# Patient Record
Sex: Male | Born: 1940 | Race: White | Hispanic: No | Marital: Married | Smoking: Never smoker
Health system: Southern US, Community
[De-identification: ages and names within clinical notes are randomized; demographics above are authoritative.]

## PROBLEM LIST (undated history)

## (undated) DIAGNOSIS — C679 Malignant neoplasm of bladder, unspecified: Secondary | ICD-10-CM

## (undated) DIAGNOSIS — E119 Type 2 diabetes mellitus without complications: Secondary | ICD-10-CM

## (undated) DIAGNOSIS — E785 Hyperlipidemia, unspecified: Secondary | ICD-10-CM

## (undated) DIAGNOSIS — F0390 Unspecified dementia without behavioral disturbance: Secondary | ICD-10-CM

## (undated) DIAGNOSIS — G309 Alzheimer's disease, unspecified: Secondary | ICD-10-CM

## (undated) DIAGNOSIS — F0280 Dementia in other diseases classified elsewhere without behavioral disturbance: Secondary | ICD-10-CM

## (undated) DIAGNOSIS — R569 Unspecified convulsions: Secondary | ICD-10-CM

## (undated) DIAGNOSIS — C669 Malignant neoplasm of unspecified ureter: Secondary | ICD-10-CM

## (undated) HISTORY — PX: OTHER SURGICAL HISTORY: SHX169

---

## 2006-10-16 ENCOUNTER — Emergency Department: Payer: Self-pay | Admitting: Emergency Medicine

## 2007-11-03 ENCOUNTER — Ambulatory Visit: Payer: Self-pay | Admitting: Surgery

## 2007-11-03 ENCOUNTER — Other Ambulatory Visit: Payer: Self-pay

## 2007-11-10 ENCOUNTER — Ambulatory Visit: Payer: Self-pay | Admitting: Surgery

## 2008-02-08 ENCOUNTER — Ambulatory Visit: Payer: Self-pay

## 2008-03-12 ENCOUNTER — Ambulatory Visit: Payer: Self-pay | Admitting: Urology

## 2008-04-09 ENCOUNTER — Ambulatory Visit: Payer: Self-pay | Admitting: Urology

## 2008-04-17 ENCOUNTER — Ambulatory Visit: Payer: Self-pay | Admitting: Urology

## 2008-08-08 ENCOUNTER — Ambulatory Visit: Payer: Self-pay | Admitting: Urology

## 2008-08-14 ENCOUNTER — Ambulatory Visit: Payer: Self-pay | Admitting: Urology

## 2009-10-13 ENCOUNTER — Ambulatory Visit: Payer: Self-pay | Admitting: Specialist

## 2010-07-19 ENCOUNTER — Emergency Department: Payer: Self-pay | Admitting: Unknown Physician Specialty

## 2010-11-05 ENCOUNTER — Emergency Department: Payer: Self-pay | Admitting: *Deleted

## 2012-05-08 ENCOUNTER — Emergency Department: Payer: Self-pay | Admitting: Emergency Medicine

## 2012-08-24 ENCOUNTER — Emergency Department: Payer: Self-pay | Admitting: Emergency Medicine

## 2012-08-24 LAB — CBC
HCT: 40.3 % (ref 40.0–52.0)
MCH: 30.8 pg (ref 26.0–34.0)
MCHC: 34 g/dL (ref 32.0–36.0)
Platelet: 204 10*3/uL (ref 150–440)
RBC: 4.45 10*6/uL (ref 4.40–5.90)

## 2012-08-24 LAB — COMPREHENSIVE METABOLIC PANEL
Albumin: 3.6 g/dL (ref 3.4–5.0)
Alkaline Phosphatase: 75 U/L (ref 50–136)
Anion Gap: 12 (ref 7–16)
BUN: 19 mg/dL — ABNORMAL HIGH (ref 7–18)
Bilirubin,Total: 0.4 mg/dL (ref 0.2–1.0)
Calcium, Total: 8.8 mg/dL (ref 8.5–10.1)
Chloride: 106 mmol/L (ref 98–107)
Creatinine: 1.08 mg/dL (ref 0.60–1.30)
EGFR (Non-African Amer.): >60
Glucose: 133 mg/dL — ABNORMAL HIGH (ref 65–99)
SGOT(AST): 24 U/L (ref 15–37)
SGPT (ALT): 28 U/L (ref 12–78)
Total Protein: 7.4 g/dL (ref 6.4–8.2)

## 2012-08-24 LAB — URINALYSIS, COMPLETE
Bilirubin,UR: NEGATIVE
Blood: NEGATIVE
Nitrite: NEGATIVE
Protein: NEGATIVE
Specific Gravity: 1.012 (ref 1.003–1.030)
WBC UR: 1 /HPF (ref 0–5)

## 2012-08-24 LAB — TROPONIN I: Troponin-I: <0.02 ng/mL

## 2013-01-05 ENCOUNTER — Emergency Department: Payer: Self-pay | Admitting: Internal Medicine

## 2013-01-05 LAB — CK TOTAL AND CKMB (NOT AT ARMC): CK, Total: 77 U/L (ref 35–232)

## 2013-01-05 LAB — URINALYSIS, COMPLETE
Bacteria: NONE SEEN
Blood: NEGATIVE
Glucose,UR: >=500 mg/dL (ref 0–75)
Ph: 5 (ref 4.5–8.0)
Protein: NEGATIVE
RBC,UR: 1 /HPF (ref 0–5)
WBC UR: <1 /HPF (ref 0–5)

## 2013-01-05 LAB — CBC
MCH: 30.6 pg (ref 26.0–34.0)
MCV: 91 fL (ref 80–100)
RDW: 13.5 % (ref 11.5–14.5)
WBC: 12.5 10*3/uL — ABNORMAL HIGH (ref 3.8–10.6)

## 2013-01-05 LAB — COMPREHENSIVE METABOLIC PANEL
Albumin: 3.8 g/dL (ref 3.4–5.0)
Alkaline Phosphatase: 77 U/L (ref 50–136)
Anion Gap: 14 (ref 7–16)
Bilirubin,Total: 0.3 mg/dL (ref 0.2–1.0)
Calcium, Total: 8.7 mg/dL (ref 8.5–10.1)
Chloride: 104 mmol/L (ref 98–107)
Co2: 19 mmol/L — ABNORMAL LOW (ref 21–32)
Creatinine: 1.01 mg/dL (ref 0.60–1.30)
Osmolality: 279 (ref 275–301)
Sodium: 137 mmol/L (ref 136–145)

## 2013-01-05 LAB — TSH: Thyroid Stimulating Horm: 1.5 u[IU]/mL

## 2014-10-14 ENCOUNTER — Encounter: Payer: Self-pay | Admitting: Emergency Medicine

## 2014-10-14 ENCOUNTER — Emergency Department: Payer: Medicare PPO

## 2014-10-14 ENCOUNTER — Inpatient Hospital Stay
Admission: EM | Admit: 2014-10-14 | Discharge: 2014-10-15 | DRG: 101 | Payer: Medicare PPO | Attending: Internal Medicine | Admitting: Internal Medicine

## 2014-10-14 DIAGNOSIS — Z7982 Long term (current) use of aspirin: Secondary | ICD-10-CM | POA: Diagnosis not present

## 2014-10-14 DIAGNOSIS — E785 Hyperlipidemia, unspecified: Secondary | ICD-10-CM | POA: Diagnosis present

## 2014-10-14 DIAGNOSIS — Z8551 Personal history of malignant neoplasm of bladder: Secondary | ICD-10-CM

## 2014-10-14 DIAGNOSIS — G309 Alzheimer's disease, unspecified: Secondary | ICD-10-CM | POA: Diagnosis present

## 2014-10-14 DIAGNOSIS — Z79899 Other long term (current) drug therapy: Secondary | ICD-10-CM | POA: Diagnosis not present

## 2014-10-14 DIAGNOSIS — I10 Essential (primary) hypertension: Secondary | ICD-10-CM | POA: Diagnosis present

## 2014-10-14 DIAGNOSIS — R569 Unspecified convulsions: Secondary | ICD-10-CM | POA: Diagnosis not present

## 2014-10-14 DIAGNOSIS — I4891 Unspecified atrial fibrillation: Secondary | ICD-10-CM

## 2014-10-14 DIAGNOSIS — I480 Paroxysmal atrial fibrillation: Secondary | ICD-10-CM | POA: Diagnosis present

## 2014-10-14 DIAGNOSIS — G40909 Epilepsy, unspecified, not intractable, without status epilepticus: Secondary | ICD-10-CM | POA: Diagnosis present

## 2014-10-14 DIAGNOSIS — F0390 Unspecified dementia without behavioral disturbance: Secondary | ICD-10-CM | POA: Diagnosis not present

## 2014-10-14 DIAGNOSIS — E119 Type 2 diabetes mellitus without complications: Secondary | ICD-10-CM

## 2014-10-14 DIAGNOSIS — F0280 Dementia in other diseases classified elsewhere without behavioral disturbance: Secondary | ICD-10-CM | POA: Diagnosis present

## 2014-10-14 DIAGNOSIS — I959 Hypotension, unspecified: Secondary | ICD-10-CM | POA: Diagnosis not present

## 2014-10-14 DIAGNOSIS — I351 Nonrheumatic aortic (valve) insufficiency: Secondary | ICD-10-CM | POA: Diagnosis not present

## 2014-10-14 HISTORY — DX: Dementia in other diseases classified elsewhere, unspecified severity, without behavioral disturbance, psychotic disturbance, mood disturbance, and anxiety: F02.80

## 2014-10-14 HISTORY — DX: Malignant neoplasm of bladder, unspecified: C67.9

## 2014-10-14 HISTORY — DX: Hyperlipidemia, unspecified: E78.5

## 2014-10-14 HISTORY — DX: Alzheimer's disease, unspecified: G30.9

## 2014-10-14 HISTORY — DX: Unspecified dementia, unspecified severity, without behavioral disturbance, psychotic disturbance, mood disturbance, and anxiety: F03.90

## 2014-10-14 HISTORY — DX: Type 2 diabetes mellitus without complications: E11.9

## 2014-10-14 HISTORY — DX: Unspecified convulsions: R56.9

## 2014-10-14 HISTORY — DX: Malignant neoplasm of unspecified ureter: C66.9

## 2014-10-14 LAB — BASIC METABOLIC PANEL
Anion gap: 13 (ref 5–15)
BUN: 21 mg/dL — AB (ref 6–20)
CALCIUM: 8.8 mg/dL — AB (ref 8.9–10.3)
CO2: 21 mmol/L — ABNORMAL LOW (ref 22–32)
CREATININE: 0.9 mg/dL (ref 0.61–1.24)
Chloride: 105 mmol/L (ref 101–111)
GFR calc Af Amer: >60 mL/min (ref >60)
Glucose, Bld: 177 mg/dL — ABNORMAL HIGH (ref 65–99)
POTASSIUM: 3.9 mmol/L (ref 3.5–5.1)
SODIUM: 139 mmol/L (ref 135–145)

## 2014-10-14 LAB — URINALYSIS COMPLETE WITH MICROSCOPIC (ARMC ONLY)
BILIRUBIN URINE: NEGATIVE
Bacteria, UA: NONE SEEN
GLUCOSE, UA: NEGATIVE mg/dL
HGB URINE DIPSTICK: NEGATIVE
LEUKOCYTES UA: NEGATIVE
NITRITE: NEGATIVE
Protein, ur: NEGATIVE mg/dL
SPECIFIC GRAVITY, URINE: 1.015 (ref 1.005–1.030)
SQUAMOUS EPITHELIAL / LPF: NONE SEEN
pH: 7 (ref 5.0–8.0)

## 2014-10-14 LAB — TROPONIN I

## 2014-10-14 LAB — CBC
HCT: 38 % — ABNORMAL LOW (ref 40.0–52.0)
Hemoglobin: 12.3 g/dL — ABNORMAL LOW (ref 13.0–18.0)
MCH: 29.3 pg (ref 26.0–34.0)
MCHC: 32.3 g/dL (ref 32.0–36.0)
MCV: 90.8 fL (ref 80.0–100.0)
PLATELETS: 187 10*3/uL (ref 150–440)
RBC: 4.18 MIL/uL — ABNORMAL LOW (ref 4.40–5.90)
RDW: 13.6 % (ref 11.5–14.5)
WBC: 6.4 10*3/uL (ref 3.8–10.6)

## 2014-10-14 LAB — HEPATIC FUNCTION PANEL
ALBUMIN: 3.8 g/dL (ref 3.5–5.0)
ALT: 16 U/L — ABNORMAL LOW (ref 17–63)
AST: 32 U/L (ref 15–41)
Alkaline Phosphatase: 57 U/L (ref 38–126)
Bilirubin, Direct: <0.1 mg/dL — ABNORMAL LOW (ref 0.1–0.5)
Total Bilirubin: 0.4 mg/dL (ref 0.3–1.2)
Total Protein: 6.7 g/dL (ref 6.5–8.1)

## 2014-10-14 LAB — PROTIME-INR
INR: 1.18
Prothrombin Time: 15.2 seconds — ABNORMAL HIGH (ref 11.4–15.0)

## 2014-10-14 MED ORDER — TAMSULOSIN HCL 0.4 MG PO CAPS
0.4000 mg | ORAL_CAPSULE | Freq: Every day | ORAL | Status: DC
  Administered 2014-10-15: 0.4 mg via ORAL
  Filled 2014-10-14: qty 1

## 2014-10-14 MED ORDER — VITAMIN D (ERGOCALCIFEROL) 1.25 MG (50000 UNIT) PO CAPS
50000.0000 [IU] | ORAL_CAPSULE | ORAL | Status: DC
  Administered 2014-10-15: 50000 [IU] via ORAL
  Filled 2014-10-14: qty 1

## 2014-10-14 MED ORDER — SODIUM CHLORIDE 0.9 % IV SOLN
75.0000 mL/h | INTRAVENOUS | Status: DC
  Administered 2014-10-14: 75 mL/h via INTRAVENOUS

## 2014-10-14 MED ORDER — LORAZEPAM 2 MG/ML IJ SOLN: 1.0000 mg | INTRAMUSCULAR | Status: DC | PRN

## 2014-10-14 MED ORDER — SODIUM CHLORIDE 0.9 % IV BOLUS (SEPSIS)
1000.0000 mL | Freq: Once | INTRAVENOUS | Status: AC
  Administered 2014-10-14: 1000 mL via INTRAVENOUS

## 2014-10-14 MED ORDER — FOLIC ACID 1 MG PO TABS
1.0000 mg | ORAL_TABLET | Freq: Every day | ORAL | Status: DC
  Administered 2014-10-15: 1 mg via ORAL
  Filled 2014-10-14: qty 1

## 2014-10-14 MED ORDER — ADULT MULTIVITAMIN W/MINERALS CH
1.0000 | ORAL_TABLET | Freq: Every day | ORAL | Status: DC
  Administered 2014-10-15: 1 via ORAL
  Filled 2014-10-14: qty 1

## 2014-10-14 MED ORDER — ASPIRIN 81 MG PO CHEW
81.0000 mg | CHEWABLE_TABLET | Freq: Every day | ORAL | Status: DC
  Administered 2014-10-15: 81 mg via ORAL
  Filled 2014-10-14: qty 1

## 2014-10-14 MED ORDER — SODIUM CHLORIDE 0.9 % IV SOLN
1000.0000 mg | Freq: Once | INTRAVENOUS | Status: DC
  Filled 2014-10-14: qty 20

## 2014-10-14 MED ORDER — VITAMIN B-1 100 MG PO TABS
100.0000 mg | ORAL_TABLET | Freq: Every day | ORAL | Status: DC
  Administered 2014-10-15: 100 mg via ORAL
  Filled 2014-10-14: qty 1

## 2014-10-14 MED ORDER — SODIUM CHLORIDE 0.9 % IV SOLN
1000.0000 mg | Freq: Once | INTRAVENOUS | Status: AC
  Administered 2014-10-14: 1000 mg via INTRAVENOUS
  Filled 2014-10-14: qty 20

## 2014-10-14 MED ORDER — SIMVASTATIN 20 MG PO TABS: 20.0000 mg | ORAL_TABLET | Freq: Every day | ORAL | Status: DC

## 2014-10-14 MED ORDER — METFORMIN HCL 500 MG PO TABS
500.0000 mg | ORAL_TABLET | Freq: Two times a day (BID) | ORAL | Status: DC
  Administered 2014-10-15: 500 mg via ORAL
  Filled 2014-10-14: qty 1

## 2014-10-14 MED ORDER — ENOXAPARIN SODIUM 40 MG/0.4ML ~~LOC~~ SOLN
40.0000 mg | SUBCUTANEOUS | Status: DC
  Administered 2014-10-14: 40 mg via SUBCUTANEOUS
  Filled 2014-10-14: qty 0.4

## 2014-10-14 NOTE — ED Notes (Signed)
Vital signs stable. 

## 2014-10-14 NOTE — ED Notes (Signed)
Introduced self to pt and sister in

## 2014-10-14 NOTE — Progress Notes (Signed)
MEDICATION RELATED CONSULT NOTE - INITIAL   Pharmacy Consult for AEDs Indication: Interactions and Monitoring  No Known Allergies  Patient Measurements: Height: 5\' 8"  (172.7 cm) Weight: 161 lb 3.2 oz (73.12 kg) (ADMISSION) IBW/kg (Calculated) : 68.4   Vital Signs: Temp: 97.4 F (36.3 C) (08/15 1551) Temp Source: Oral (08/15 1551) BP: 91/57 mmHg (08/15 1551) Pulse Rate: 65 (08/15 1551) Intake/Output from previous day:   Intake/Output from this shift: Total I/O In: -  Out: 2 [Urine:2]  Labs:  Recent Labs  10/14/14 0604  WBC 6.4  HGB 12.3*  HCT 38.0*  PLT 187  CREATININE 0.90  ALBUMIN 3.8  PROT 6.7  AST 32  ALT 16*  ALKPHOS 57  BILITOT 0.4  BILIDIR <0.1*  IBILI NOT CALCULATED   Estimated Creatinine Clearance: 69.7 mL/min (by C-G formula based on Cr of 0.9).   Microbiology: No results found for this or any previous visit (from the past 720 hour(s)).  Medical History: Past Medical History  Diagnosis Date  . Seizures   . Alzheimer's dementia   . Diabetes mellitus without complication   . Hypertension     Medications:  Prescriptions prior to admission  Medication Sig Dispense Refill Last Dose  . aspirin 81 MG chewable tablet Chew 81 mg by mouth daily.   10/13/2014 at Unknown time  . metFORMIN (GLUCOPHAGE) 500 MG tablet Take 500 mg by mouth 2 (two) times daily.   10/13/2014 at Unknown time  . simvastatin (ZOCOR) 20 MG tablet Take 20 mg by mouth at bedtime.   10/13/2014 at Unknown time  . tamsulosin (FLOMAX) 0.4 MG CAPS capsule Take 0.4 mg by mouth daily.   10/13/2014 at Unknown time  . Vitamin D, Ergocalciferol, (DRISDOL) 50000 UNITS CAPS capsule Take 50,000 Units by mouth every 7 (seven) days.   10/08/2014 at unknown   Scheduled:  . aspirin  81 mg Oral Daily  . enoxaparin (LOVENOX) injection  40 mg Subcutaneous Q24H  . folic acid  1 mg Oral Daily  . metFORMIN  500 mg Oral BID WC  . multivitamin with minerals  1 tablet Oral Daily  . simvastatin  20 mg Oral  QHS  . tamsulosin  0.4 mg Oral Daily  . thiamine  100 mg Oral Daily  . [START ON 10/15/2014] Vitamin D (Ergocalciferol)  50,000 Units Oral Q7 days   Infusions:  . sodium chloride 75 mL/hr (10/14/14 1717)   PRN: LORazepam  Assessment: 74 y/o M admitted from [facility] with seizures. Patient was loaded with phenytoin but standing dose is not ordered. Neurology is consulted. Patient has orders for prn lorazepam.   Plan:  There are no drug interactions that would require therapy modification. As previously stated there are no standing orders for AEDs. Will continue to monitor and f/u neurology recommendations.   Ulice Dash D 10/14/2014,5:45 PM

## 2014-10-14 NOTE — ED Notes (Signed)
Pt to ED via EMS from [facility] for muscle rigidity and seizure like activity. Pt was in a standing position when the episode began and was caught by facility staff. CBG via EMS was 156 and 2mg  Versed was given via IV.

## 2014-10-14 NOTE — ED Notes (Signed)
Family at bedside. 

## 2014-10-14 NOTE — Plan of Care (Signed)
Problem: Consults Goal: Neurology consult Outcome: Completed/Met Date Met:  10/14/14 Neuro was consulted

## 2014-10-14 NOTE — Progress Notes (Signed)
Skin checked by Gay Filler RN. Nonverbal. BP low and MD Manuella Ghazi was notified. Room air. NSR. Incontinent. No family at the bedside unable to finish admission critieria.

## 2014-10-14 NOTE — ED Notes (Signed)
Resting on side with eyes closed, noted to move both arms purposefully, ie scratching, bends legs to comfort position, does not follow command or verbalize, face relaxed, monitor a fib

## 2014-10-14 NOTE — ED Provider Notes (Signed)
[facility] Emergency Department Provider Note  ____________________________________________  Time seen: Approximately 5:58 AM  I have reviewed the triage vital signs and the nursing notes.   HISTORY  Chief Complaint Seizures History limited by patient's postictal status in my colon no family at bedside   HPI Tyler Cordova is a 74 y.o. male who presents to the ED via EMS from [place] for seizure. Patient was in a standing position when he began to seize, and was caught by staff. Patient did not strike head. EMS reports seizure activity upon their arrival; patient given 2 mg Versed IV.There was some report that family told nursing facility patient has epileptic seizures but he does not take medicines for it. There is no listed medical diagnosis of seizures on patient's nursing home forms. There is also no listed medical diagnosis of atrial fibrillation.   Past medical history Dementia Diabetes Hyperlipidemia BPH  There are no active problems to display for this patient.   No past surgical history on file.  Current Outpatient Rx  Name  Route  Sig  Dispense  Refill  . aspirin 81 MG chewable tablet   Oral   Chew 81 mg by mouth daily.         . metFORMIN (GLUCOPHAGE) 500 MG tablet   Oral   Take 500 mg by mouth 2 (two) times daily.         . simvastatin (ZOCOR) 20 MG tablet   Oral   Take 20 mg by mouth at bedtime.         . tamsulosin (FLOMAX) 0.4 MG CAPS capsule   Oral   Take 0.4 mg by mouth daily.         . Vitamin D, Ergocalciferol, (DRISDOL) 50000 UNITS CAPS capsule   Oral   Take 50,000 Units by mouth every 7 (seven) days.           Allergies Review of patient's allergies indicates no known allergies.  No family history on file.  Social History Social History  Substance Use Topics  . Smoking status: Not on file  . Smokeless tobacco: Not on file  . Alcohol Use: Not on file  Nonsmoker  Review of  Systems Constitutional: No fever/chills Eyes: No visual changes. ENT: No sore throat. Cardiovascular: Denies chest pain. Respiratory: Denies shortness of breath. Gastrointestinal: No abdominal pain.  No nausea, no vomiting.  No diarrhea.  No constipation. Genitourinary: Negative for dysuria. Musculoskeletal: Negative for back pain. Skin: Negative for rash. Neurological: Positive for seizure activity. Negative for headaches, focal weakness or numbness.  Limited by postictal state; 10-point ROS otherwise negative.  ____________________________________________   PHYSICAL EXAM:  VITAL SIGNS: ED Triage Vitals  Enc Vitals Group     BP 10/14/14 0554 117/78 mmHg     Pulse Rate 10/14/14 0554 96     Resp 10/14/14 0554 20     Temp 10/14/14 0554 97.9 F (36.6 C)     Temp Source 10/14/14 0554 Oral     SpO2 10/14/14 0554 95 %     Weight 10/14/14 0554 176 lb 9.6 oz (80.105 kg)     Height 10/14/14 0554 5\' 8"  (1.727 m)     Head Cir --      Peak Flow --      Pain Score --      Pain Loc --      Pain Edu? --      Excl. in [place]? --     Constitutional: Somnolent, arousable  to painful stimuli.  Eyes: Conjunctivae are normal. PERRL. EOMI. Head: Atraumatic. Nose: No congestion/rhinnorhea. Mouth/Throat: Mucous membranes are moist.  Oropharynx non-erythematous. Neck: No stridor. No cervical spine tenderness to palpation. No step-offs or deformity noted. Cardiovascular: Irregularly irregular rate. Grossly normal heart sounds.  Good peripheral circulation. Respiratory: Normal respiratory effort.  No retractions. Lungs CTAB. Gastrointestinal: Soft and nontender. No distention. No abdominal bruits. No CVA tenderness. Musculoskeletal: No lower extremity tenderness nor edema.  No joint effusions. Neurologic: Somnolent, arousable to painful stimuli.  Skin:  Skin is warm, dry and intact. No rash noted. Psychiatric: Mood and affect are normal. Speech and behavior are  normal.  ____________________________________________   LABS (all labs ordered are listed, but only abnormal results are displayed)  Labs Reviewed  CBC - Abnormal; Notable for the following:    RBC 4.18 (*)    Hemoglobin 12.3 (*)    HCT 38.0 (*)    All other components within normal limits  PROTIME-INR - Abnormal; Notable for the following:    Prothrombin Time 15.2 (*)    All other components within normal limits  BASIC METABOLIC PANEL  HEPATIC FUNCTION PANEL  TROPONIN I  URINALYSIS COMPLETEWITH MICROSCOPIC ([facility] ONLY)   ____________________________________________  EKG  ED ECG REPORT I, SUNG,JADE J, the attending physician, personally viewed and interpreted this ECG.   Date: 10/14/2014  EKG Time: 0556  Rate: 114  Rhythm: atrial fibrillation, rate 114  Axis: Normal  Intervals:none  ST&T Change: Nonspecific Atrial fibrillation is new compared to an EKG dated November 2014 ____________________________________________  RADIOLOGY  CT head without contrast (viewed by me, interpreted per Dr. Dorann Lodge): No acute intracranial process.  Moderate to severe global brain atrophy. Severe white matter changes compatible with chronic small vessel ischemic disease. 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  Portable chest x-ray (viewed by me, interpreted per Dr.  Pollyann Kennedy): No acute disease  ____________________________________________   PROCEDURES  Procedure(s) performed: None  Critical Care performed:   CRITICAL CARE Performed by: Paulette Blanch   Total critical care time: 30 minutes  Critical care time was exclusive of separately billable procedures and treating other patients.  Critical care was necessary to treat or prevent imminent or life-threatening deterioration.  Critical care was time spent personally by me on the following activities: development of treatment plan with patient and/or surrogate as well as nursing, discussions with  consultants, evaluation of patient's response to treatment, examination of patient, obtaining history from patient or surrogate, ordering and performing treatments and interventions, ordering and review of laboratory studies, ordering and review of radiographic studies, pulse oximetry and re-evaluation of patient's condition.  ____________________________________________   INITIAL IMPRESSION / ASSESSMENT AND PLAN / ED COURSE  Pertinent labs & imaging results that were available during my care of the patient were reviewed by me and considered in my medical decision making (see chart for details).  74 year old male s/p seizure activity. Will obtain screening lab labs, CT head, urinalysis and reassess.  ----------------------------------------- 6:18 AM on 10/14/2014 -----------------------------------------  Patient's sister in law is at bedside. Patient's spouse and the rest of his family is currently in [place] an [place] trip. Sister-in-law states patient has approximately 2-3 seizures per year but does not take medicines. Unsure if he has had neurology evaluation for seizures. Denies history of atrial fibrillation.  ----------------------------------------- 7:17 AM on 10/14/2014 -----------------------------------------  Patient remains somnolent.Marland Kitchen Updated family of laboratory and imaging studies. Heart rate remains in atrial fibrillation now at a rate of 86. Discussed with hospitalist  to evaluate patient in the emergency department for admission. ____________________________________________   FINAL CLINICAL IMPRESSION(S) / ED DIAGNOSES  Final diagnoses:  Seizure  New onset atrial fibrillation      Paulette Blanch, MD 10/14/14 [phone]

## 2014-10-14 NOTE — H&P (Addendum)
[place] [facility] [place] NAME: Tyler Cordova    MR#:  [id number]  DATE OF BIRTH:  05-12-1940  DATE OF ADMISSION:  10/14/2014  PRIMARY CARE PHYSICIAN: Lenard Simmer, MD   REQUESTING/REFERRING PHYSICIAN: Lurline Hare MD  CHIEF COMPLAINT:   Chief Complaint  Patient presents with  . Seizures   HISTORY OF PRESENT ILLNESS:  Tyler Cordova  is a 74 y.o. male with a known history of seizures, dementia, DM was brought to the ED via EMS from [place] for seizure. Per ED records Patient was in a standing position when he began to seize, and was caught by staff. Patient did not strike head. EMS reported seizure activity upon their arrival; patient given 2 mg Versed IV.There was some report that family told nursing facility patient has epileptic seizures but he does not take medicines for it although here was no listed medical diagnosis of seizures on patient's nursing home forms. There is also no listed medical diagnosis of atrial fibrillation per ED records. When I evaluated patient he's very confused and not able to provide any history. PAST MEDICAL HISTORY:   Past Medical History  Diagnosis Date  . Seizures   . Alzheimer's dementia   . Diabetes mellitus without complication   . Hypertension   severe dementia PAST SURGICAL HISTORY:  No past surgical history on file.  Prostate/bladder surgery SOCIAL HISTORY:   Social History  Substance Use Topics  . Smoking status: Not on file  . Smokeless tobacco: Not on file  . Alcohol Use: Not on file  L[facility] at [facility] while his family is in [place] Nonsmoker, non alcoholic FAMILY HISTORY:  No family history on file.  No significant family history per daughter in law DRUG ALLERGIES:  No Known Allergies REVIEW OF SYSTEMS:  Review of Systems  Unable to perform ROS: mental acuity   MEDICATIONS AT HOME:   Prior to Admission medications   Medication Sig Start Date End Date Taking?  Authorizing Provider  aspirin 81 MG chewable tablet Chew 81 mg by mouth daily.   Yes Historical Provider, MD  metFORMIN (GLUCOPHAGE) 500 MG tablet Take 500 mg by mouth 2 (two) times daily.   Yes Historical Provider, MD  simvastatin (ZOCOR) 20 MG tablet Take 20 mg by mouth at bedtime.   Yes Historical Provider, MD  tamsulosin (FLOMAX) 0.4 MG CAPS capsule Take 0.4 mg by mouth daily.   Yes Historical Provider, MD  Vitamin D, Ergocalciferol, (DRISDOL) 50000 UNITS CAPS capsule Take 50,000 Units by mouth every 7 (seven) days.   Yes Historical Provider, MD   VITAL SIGNS:  Blood pressure 91/57, pulse 65, temperature 97.4 F (36.3 C), temperature source Oral, resp. rate 18, height 5\' 8"  (1.727 m), weight 73.12 kg (161 lb 3.2 oz), SpO2 99 %. PHYSICAL EXAMINATION:  Physical Exam  Constitutional: He is well-developed, well-nourished, and in no distress.  HENT:  Head: Normocephalic and atraumatic.  Eyes: Conjunctivae and EOM are normal. Pupils are equal, round, and reactive to light.  Neck: Normal range of motion. Neck supple. No tracheal deviation present. No thyromegaly present.  Cardiovascular: Normal rate, regular rhythm and normal heart sounds.   Pulmonary/Chest: Effort normal and breath sounds normal. No respiratory distress. He has no wheezes. He exhibits no tenderness.  Abdominal: Soft. Bowel sounds are normal. He exhibits no distension. There is no tenderness.  Musculoskeletal: Normal range of motion.  Neurological: No cranial nerve deficit.  Very confused and mumbling which doesn't make  any sense  Skin: Skin is warm and dry. No rash noted.  Psychiatric: He is agitated.  Unable to assess due to his confusion and current mental status   LABORATORY PANEL:   CBC  Recent Labs Lab 10/14/14 0604  WBC 6.4  HGB 12.3*  HCT 38.0*  PLT 187   ------------------------------------------------------------------------------------------------------------------  Chemistries   Recent Labs Lab  10/14/14 0604  NA 139  K 3.9  CL 105  CO2 21*  GLUCOSE 177*  BUN 21*  CREATININE 0.90  CALCIUM 8.8*  AST 32  ALT 16*  ALKPHOS 57  BILITOT 0.4   RADIOLOGY:  Ct Head Wo Contrast  10/14/2014   CLINICAL DATA:  Muscle rigidity and seizure like activity. History of bladder cancer and seizures.  EXAM: CT HEAD WITHOUT CONTRAST  TECHNIQUE: Contiguous axial images were obtained from the base of the skull through the vertex without intravenous contrast.  COMPARISON:  CT head January 05, 2013  FINDINGS: No intraparenchymal hemorrhage, mass effect, midline shift or acute large vascular territory infarct. Moderate to severe ventriculomegaly on the basis of global parenchymal brain volume loss. Confluent supratentorial white matter hypodensities again noted. 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  No abnormal extra-axial fluid collections. Moderate calcific atherosclerosis of the carotid siphons. Basal cisterns are patent. Included ocular globes and orbital contents are unremarkable. No skull fracture.  IMPRESSION: No acute intracranial process.  Moderate to severe global brain atrophy. Severe white matter changes compatible with chronic small vessel ischemic disease. 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   Electronically Signed   By: Elon Alas M.D.   On: 10/14/2014 06:47   Dg Chest Port 1 View  10/14/2014   CLINICAL DATA:  Possible seizure.  EXAM: PORTABLE CHEST - 1 VIEW  COMPARISON:  Single view of the chest 01/05/2013.  FINDINGS: The lungs are clear. Heart size is upper normal. No pneumothorax or pleural effusion is identified. Aortic atherosclerosis is noted.  IMPRESSION: No acute disease.   Electronically Signed   By: Inge Rise M.D.   On: 10/14/2014 07:18   IMPRESSION AND PLAN:   * Seizure: will keep him on prn Ativan for now. C/s neuro to decide need for Antiepileptic meds. So far I am unable to get in touch with any family. Seizure  precaution  * New onset A.fib: will c/s Cardio. Monitor on Tele  * Hypotension with h/o HTN: monitor on tele. Hold off any BP meds  * DM: will check Hba1c. SSI for now.   All the records are reviewed and case discussed with ED provider.  Tried reaching family at all 3 # listed in [facility] but no LUCK although I was able to get in touch with [facility] - talked with nurse who wasn't much helpful.  Family (daughter in law Santiago Glad) just came by room and I was able to talk with her at length - she reports his wife normally takes care of him at home but all family is in [place] so they dropped him at [facility] assisted living for time being.   She confirms - this is his Baseline Mental status.  She lives at [facility]. And alternate # [phone] for her mom if other [phone] # don't work.  CODE STATUS: Full Code  TOTAL TIME TAKING CARE OF THIS PATIENT: 55 minutes.    [facility], Quenton Recendez M.D on 10/14/2014 at 5:26 PM  Between 7am to 6pm - Pager - [phone]  After 6pm go to [email or web address] -  password [email or web address] [facility]  [facility]  Office  [phone]  CC: Primary care physician; Lenard Simmer, MD

## 2014-10-14 NOTE — ED Notes (Signed)
Pt moved to room er16 pending inpatient bed assignment, does open eyes briefly when moved to bed, does move both arms purposefully however is nonverbal and does not follow commands.

## 2014-10-14 NOTE — ED Notes (Signed)
Pt has been incontinent of large amount of urine, spec not obtained.

## 2014-10-14 NOTE — Consult Note (Signed)
CC: seizure  HPI: Tyler Cordova is an 74 y.o. male  known history of seizures, severe dementia, DM was brought to the ED via EMS from [place] for seizure. Per ED records Patient was in a standing position when he began to seize, and was caught by staff. Patient did not strike head. EMS reported seizure activity upon their arrival; patient given 2 mg Versed IV.  Past Medical History  Diagnosis Date  . Seizures   . Alzheimer's dementia   . Diabetes mellitus without complication   . Hypertension     No past surgical history on file.  No family history on file.  Social History:  has no tobacco, alcohol, and drug history on file.  No Known Allergies  Medications: I have reviewed the patient's current medications.  ROS: Unable to obtain   Physical Examination: Blood pressure 91/57, pulse 65, temperature 97.4 F (36.3 C), temperature source Oral, resp. rate 18, height 5\' 8"  (1.727 m), weight 73.12 kg (161 lb 3.2 oz), SpO2 99 %.  Opens eyes to voice Follows me around room Does not follow commands ? Baseline Symmetrical motor strength bilaterally Responds to visual threats.   Laboratory Studies:   Basic Metabolic Panel:  Recent Labs Lab 10/14/14 0604  NA 139  K 3.9  CL 105  CO2 21*  GLUCOSE 177*  BUN 21*  CREATININE 0.90  CALCIUM 8.8*    Liver Function Tests:  Recent Labs Lab 10/14/14 0604  AST 32  ALT 16*  ALKPHOS 57  BILITOT 0.4  PROT 6.7  ALBUMIN 3.8   No results for input(s): LIPASE, AMYLASE in the last 168 hours. No results for input(s): AMMONIA in the last 168 hours.  CBC:  Recent Labs Lab 10/14/14 0604  WBC 6.4  HGB 12.3*  HCT 38.0*  MCV 90.8  PLT 187    Cardiac Enzymes:  Recent Labs Lab 10/14/14 0604  TROPONINI <0.03    BNP: Invalid input(s): POCBNP  CBG: No results for input(s): GLUCAP in the last 168 hours.  Microbiology: No results found for this or any previous visit.  Coagulation Studies:  Recent Labs  10/14/14 0604  LABPROT 15.2*  INR 1.18    Urinalysis:  Recent Labs Lab 10/14/14 1411  COLORURINE YELLOW*  LABSPEC 1.015  PHURINE 7.0  GLUCOSEU NEGATIVE  HGBUR NEGATIVE  BILIRUBINUR NEGATIVE  KETONESUR TRACE*  PROTEINUR NEGATIVE  NITRITE NEGATIVE  LEUKOCYTESUR NEGATIVE    Lipid Panel:  No results found for: CHOL, TRIG, HDL, CHOLHDL, VLDL, LDLCALC  HgbA1C: No results found for: HGBA1C  Urine Drug Screen:  No results found for: LABOPIA, COCAINSCRNUR, LABBENZ, AMPHETMU, THCU, LABBARB  Alcohol Level: No results for input(s): ETH in the last 168 hours.  Imaging: Ct Head Wo Contrast  10/14/2014   CLINICAL DATA:  Muscle rigidity and seizure like activity. History of bladder cancer and seizures.  EXAM: CT HEAD WITHOUT CONTRAST  TECHNIQUE: Contiguous axial images were obtained from the base of the skull through the vertex without intravenous contrast.  COMPARISON:  CT head January 05, 2013  FINDINGS: No intraparenchymal hemorrhage, mass effect, midline shift or acute large vascular territory infarct. Moderate to severe ventriculomegaly on the basis of global parenchymal brain volume loss. Confluent supratentorial white matter hypodensities again noted. 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  No abnormal extra-axial fluid collections. Moderate calcific atherosclerosis of the carotid siphons. Basal cisterns are patent. Included ocular globes and orbital contents are unremarkable. No skull fracture.  IMPRESSION: No acute intracranial  process.  Moderate to severe global brain atrophy. Severe white matter changes compatible with chronic small vessel ischemic disease. 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   Electronically Signed   By: Elon Alas M.D.   On: 10/14/2014 06:47   Dg Chest Port 1 View  10/14/2014   CLINICAL DATA:  Possible seizure.  EXAM: PORTABLE CHEST - 1 VIEW  COMPARISON:  Single view of the chest 01/05/2013.  FINDINGS: The  lungs are clear. Heart size is upper normal. No pneumothorax or pleural effusion is identified. Aortic atherosclerosis is noted.  IMPRESSION: No acute disease.   Electronically Signed   By: Inge Rise M.D.   On: 10/14/2014 07:18     Assessment/Plan:  74 y.o. male  known history of seizures, severe dementia, DM was brought to the ED via EMS from [place] for seizure. Per ED records Patient was in a standing position when he began to seize, and was caught by staff. Patient did not strike head. EMS reported seizure activity upon their arrival; patient given 2 mg Versed IV.  Pt does not drive at baseline I will start him on keppra 500 q12 without any loady Unclear what is pt's baseline is and no family at bedside.  Leotis Pain  10/14/2014, 6:46 PM

## 2014-10-15 ENCOUNTER — Encounter: Payer: Self-pay | Admitting: Student

## 2014-10-15 ENCOUNTER — Inpatient Hospital Stay (HOSPITAL_COMMUNITY)
Admit: 2014-10-15 | Discharge: 2014-10-15 | Disposition: A | Discharge status: Home / Self Care | Payer: Medicare PPO | Attending: Physician Assistant | Admitting: Physician Assistant

## 2014-10-15 DIAGNOSIS — R569 Unspecified convulsions: Secondary | ICD-10-CM

## 2014-10-15 DIAGNOSIS — C679 Malignant neoplasm of bladder, unspecified: Secondary | ICD-10-CM | POA: Insufficient documentation

## 2014-10-15 DIAGNOSIS — G309 Alzheimer's disease, unspecified: Secondary | ICD-10-CM

## 2014-10-15 DIAGNOSIS — E119 Type 2 diabetes mellitus without complications: Secondary | ICD-10-CM

## 2014-10-15 DIAGNOSIS — I959 Hypotension, unspecified: Secondary | ICD-10-CM

## 2014-10-15 DIAGNOSIS — I480 Paroxysmal atrial fibrillation: Secondary | ICD-10-CM

## 2014-10-15 DIAGNOSIS — I351 Nonrheumatic aortic (valve) insufficiency: Secondary | ICD-10-CM

## 2014-10-15 DIAGNOSIS — C669 Malignant neoplasm of unspecified ureter: Secondary | ICD-10-CM | POA: Insufficient documentation

## 2014-10-15 DIAGNOSIS — F0280 Dementia in other diseases classified elsewhere without behavioral disturbance: Secondary | ICD-10-CM | POA: Insufficient documentation

## 2014-10-15 DIAGNOSIS — E785 Hyperlipidemia, unspecified: Secondary | ICD-10-CM | POA: Diagnosis present

## 2014-10-15 DIAGNOSIS — F0390 Unspecified dementia without behavioral disturbance: Secondary | ICD-10-CM

## 2014-10-15 LAB — HEMOGLOBIN A1C: Hgb A1c MFr Bld: 6.4 % — ABNORMAL HIGH (ref 4.0–6.0)

## 2014-10-15 NOTE — Plan of Care (Signed)
Problem: Consults Goal: PEDS/Adult Seizures Patient Education See Patient Education Module for education specifics.  Outcome: Not Met (add Reason) Not able to due to loc  Problem: Phase III Progression Outcomes Goal: Anticipatory guidance based on developmental age Outcome: Not Met (add Reason) Unable to meet due to loc

## 2014-10-15 NOTE — Progress Notes (Signed)
Patient lying in bed, appears to not be in any distress and is alert.  Unable to communicate.

## 2014-10-15 NOTE — Evaluation (Signed)
Clinical/Bedside Swallow Evaluation Patient Details  Name: Tyler Cordova MRN: [id number] Date of Birth: November 18, 1940  Today's Date: 10/15/2014 Time: SLP Start Time (ACUTE ONLY): 9485 SLP Stop Time (ACUTE ONLY): 1300 SLP Time Calculation (min) (ACUTE ONLY): 45 min  Past Medical History:  Past Medical History  Diagnosis Date  . Seizures     Per family, patient has epileptic seizures; but he does not take medicines for it;   . Alzheimer's dementia     Rapidly progressive Alzheimer's Dementia. Still at home but now with increasing behavioral problems. Uses Diapers for Bowel and Urinary leakage. Wife normally takes care of him at home but she and most of his family are in [place] for an extended period of time so he is staying at [facility] assisted living for time being  . Diabetes mellitus without complication     Dx 4/62/7035, followed with Tyler Rend MD   . Bladder cancer     Multifocal Low-Grade Transitional Cell Carcinoma; followed by Tyler Booty, MD([facility])  . Hyperlipidemia     On statin; dx 10/27/2012, followed with Tyler Rend MD    . Ureteral cancer     Bilateral; followed by Tyler Booty, MD([facility])  . Dementia    Past Surgical History: History reviewed. No pertinent past surgical history. HPI:  Pt is a 74 y.o. male with a known history of seizures, severe Dementia, DM who was brought to the ED via EMS from [place] for seizure. Per ED records Patient was in a standing position when he began to seize, and was caught by staff. Patient did not strike head. EMS reported seizure activity upon their arrival; patient given 2 mg Versed IV.Marland Kitchen   Assessment / Plan / Recommendation Clinical Impression  Pt appears to tolerate po's of thin liquids and purees/mech soft boluses w/ no overt s/s of aspiration noted; no overt coughing or throat clearing occured during po trials w/ SLP. Pt required tactile cues of utensil to lips to take po trials from straw and spoon.  Pt exhibited decreased awareness of environment w/ eyes remaining closed most of the session; nonverbal. Pt is at reduced risk for aspiration following aspiration precautions and assistance/monitoring during meals w/ feeding and taking po's. Rec. a mech soft/regular diet w/ meats cut well; thin liquids; straws if no coughing noted. Rec. meds in puree for easier swallowing sec. to Cognitive decline. ST will be available for f/u and education as nec.     Aspiration Risk   (reduced)    Diet Recommendation Age appropriate regular solids;Thin (meats cut well; moistened)   Medication Administration: Whole meds with puree (as nec.) Compensations: Minimize environmental distractions;Small sips/bites;Check for pocketing    Other  Recommendations Recommended Consults:  (TBD) Oral Care Recommendations: Oral care BID;Staff/trained caregiver to provide oral care   Follow Up Recommendations       Frequency and Duration min 2x/week  1 week   Pertinent Vitals/Pain Nothing indicated    SLP Swallow Goals  see care plan   Swallow Study Prior Functional Status   resides at [facility]; assistance w/ all ADLs.     General Date of Onset: 10/14/14 Other Pertinent Information: Pt is a 74 y.o. male with a known history of seizures, severe Dementia, DM who was brought to the ED via EMS from [place] for seizure. Per ED records Patient was in a standing position when he began to seize, and was caught by staff. Patient did not strike head. EMS reported seizure activity upon  their arrival; patient given 2 mg Versed IV.Marland Kitchen Type of Study: Bedside swallow evaluation Previous Swallow Assessment: none indicated Diet Prior to this Study: Regular;Thin liquids (unknown though) Temperature Spikes Noted: No (ebc 6.4) Respiratory Status: Room air History of Recent Intubation: No Behavior/Cognition: Confused;Requires cueing;Doesn't follow directions Oral Cavity - Dentition: Adequate natural dentition/normal for age (during  mastication ) Self-Feeding Abilities: Total assist Patient Positioning: Upright in bed Baseline Vocal Quality:  (nonverbal) Volitional Cough: Cognitively unable to elicit Volitional Swallow: Unable to elicit    Oral/Motor/Sensory Function Overall Oral Motor/Sensory Function:  (unable to assess sec. to Cognitive decline) Labial ROM: Within Functional Limits (w/ trials) Labial Symmetry: Within Functional Limits Labial Strength: Within Functional Limits (w/ trials) Lingual ROM: Within Functional Limits (w/ trials) Lingual Strength: Within Functional Limits (w/ trials) Facial Symmetry: Within Functional Limits Mandible: Within Functional Limits   Ice Chips Ice chips: Within functional limits Presentation: Spoon (fed; x3)   Thin Liquid Thin Liquid: Within functional limits Presentation: Straw (fed; x6 trials) Oral Phase Impairments:  (required tactile cues of straw to lips to drink; take po's) Oral Phase Functional Implications:  (none) Pharyngeal  Phase Impairments:  (none)    Nectar Thick Nectar Thick Liquid: Not tested   Honey Thick Honey Thick Liquid: Not tested   Puree Puree: Within functional limits Presentation: Spoon (fed; 6 trials) Other Comments: required tactile cues to lips to take po's   Solid   GO    Solid: Within functional limits Presentation:  (fed; 4 trials) Other Comments: required tactile cues to lips to take po's      Tyler Kenner, MS, CCC-SLP  Tyler Cordova 10/15/2014,3:13 PM

## 2014-10-15 NOTE — Discharge Instructions (Signed)
Consider starting Anti-seizure medication (Keppra 500 mg PO bid) if he has more seizure in future after discussing with family. At this time family doesn't want to start this medication or any further work up considering his severe dementia and his quality of life.   Epilepsy People with epilepsy have times when they shake and jerk uncontrollably (seizures). This happens when there is a sudden change in brain function. Epilepsy may have many possible causes. Anything that disturbs the normal pattern of brain cell activity can lead to seizures. HOME CARE   Follow your doctor's instructions about driving and safety during normal activities.  Get enough sleep.  Only take medicine as told by your doctor.  Avoid things that you know can cause you to have seizures (triggers).  Write down when your seizures happen and what you remember about each seizure. Write down anything you think may have caused the seizure to happen.  Tell the people you live and work with that you have seizures. Make sure they know how to help you. They should:  Cushion your head and body.  Turn you on your side.  Not restrain you.  Not place anything inside your mouth.  Call for local emergency medical help if there is any question about what has happened.  Keep all follow-up visits with your doctor. This is very important. GET HELP IF:  You get an infection or start to feel sick. You may have more seizures when you are sick.  You are having seizures more often.  Your seizure pattern is changing. GET HELP RIGHT AWAY IF:   A seizure does not stop after a few seconds or minutes.  A seizure causes you to have trouble breathing.  A seizure gives you a very bad headache.  A seizure makes you unable to speak or use a part of your body. Document Released: 12/13/2008 Document Revised: 12/06/2012 Document Reviewed: 09/27/2012 [facility] Patient Information 2015 [place], [place]. This information is not intended to  replace advice given to you by your health care provider. Make sure you discuss any questions you have with your health care provider.

## 2014-10-15 NOTE — Progress Notes (Signed)
*  PRELIMINARY RESULTS* Echocardiogram 2D Echocardiogram has been performed.  Tyler Cordova 10/15/2014, 10:48 AM

## 2014-10-15 NOTE — Consult Note (Signed)
Cardiology Consultation Note  Patient ID: Tyler Cordova, MRN: [id number], DOB/AGE: 74-Oct-1942 74 y.o. Admit date: 10/14/2014   Date of Consult: 10/15/2014 Primary Physician: Lenard Simmer, MD Primary Cardiologist: New to [facility]  Chief Complaint: Seizure Reason for Consult: New-onset A fib  HPI: 74 y.o. male with h/o hyperlipidemia, diabetes, reported seizures (per family; not on medication), rapidly progressive Alzheimer's dementia (normally lives at home with family), and bladder cancer s/p TURBT (follows at [facility]).  He was brought to the [facility] ED on 8/15 via EMS for seizure-like activity while standing and experienced another seizure when EMS arrived.    Tyler Cordova is new to [facility].  Per nursing notes, he is non-verbal at baseline 2/2 dementia and currently has no family at bedside.  A query of Care Everywhere does not show any cardiology visits, except for surgical clearance for TURBT.  Several EKGs at [facility] on 11/15/13 shows that he was in NSR.   He lives with his wife at baseline, however she is on an extended trip to [place] and has placed him at nursing home for care while she goes on this trip.   Tyler Cordova was brought to the ED on 8/15 via EMS from [facility] for muscle rigidity and seizure-like activity. Pt was in a standing position when the episode began and he was caught by facility staff. He was also found to be having seizure-like activity when they arrived.  CBG via EMS was 156 and 2mg  Versed was given via IV. Patient did not strike his head during the seizure activity.  In the ED, he did open his eyes briefly when he was moved to a bed and moved his arms purposefully; however, at that time he did not follow commands. In the ED telemetry was showing A fib on the monitor, but his family denies any history of A fib.  His daughter-in-law stated that he has a history of epileptic seizures and has approximately 2-3 seizures per year; however he is not taking any medications for  this and there is no record of history of seizures at the assisted living facility.    During today's visit, he was following the nurse's commands, mumbled, moved his arms and legs purposefully, and turned his head towards her had when she offered him apple sauce.  He had no difficulty eating. He would open his eyes when she asked to see them to examine his pupils, but he quickly closed them as soon as the light was on his eyes. He is unable to answer any questions, as he is non-verbal and has no family present currently.  EKG in ED: Afib with RVR, 114 BPM, QTc 482; Troponin negative x3; K: 3.9; Tele: sinus brady, HR in 50's     Past Medical History  Diagnosis Date  . Seizures     Per family, patient has epileptic seizures; but he does not take medicines for it;   . Alzheimer's dementia     Rapidly progressive Alzheimer's Dementia. Still at home but now with increasing behavioral problems. Uses Diapers for Bowel and Urinary leakage. Wife normally takes care of him at home but she and most of his family are in [place] for an extended period of time so they dropped him at [facility] assisted living for time being  . Diabetes mellitus without complication     Dx 03/02/5850, followed with Delrae Rend MD   . Bladder cancer     Multifocal Low-Grade Transitional Cell Carcinoma; followed by Ivin Booty,  MD([facility])  . Hyperlipidemia     On statin; dx 10/27/2012, followed with Delrae Rend MD    . Ureteral cancer     Bilateral; followed by Ivin Booty, MD([facility])      Most Recent Cardiac Studies: No records found when Care Everywhere was queried and no previous records with [facility]   Surgical History: History reviewed. No pertinent past surgical history.   Home Meds: Prior to Admission medications   Medication Sig Start Date End Date Taking? Authorizing Provider  aspirin 81 MG chewable tablet Chew 81 mg by mouth daily.   Yes Historical Provider, MD  metFORMIN  (GLUCOPHAGE) 500 MG tablet Take 500 mg by mouth 2 (two) times daily.   Yes Historical Provider, MD  simvastatin (ZOCOR) 20 MG tablet Take 20 mg by mouth at bedtime.   Yes Historical Provider, MD  tamsulosin (FLOMAX) 0.4 MG CAPS capsule Take 0.4 mg by mouth daily.   Yes Historical Provider, MD  Vitamin D, Ergocalciferol, (DRISDOL) 50000 UNITS CAPS capsule Take 50,000 Units by mouth every 7 (seven) days.   Yes Historical Provider, MD    Inpatient Medications:  . aspirin  81 mg Oral Daily  . enoxaparin (LOVENOX) injection  40 mg Subcutaneous Q24H  . folic acid  1 mg Oral Daily  . metFORMIN  500 mg Oral BID WC  . multivitamin with minerals  1 tablet Oral Daily  . simvastatin  20 mg Oral QHS  . tamsulosin  0.4 mg Oral Daily  . thiamine  100 mg Oral Daily  . Vitamin D (Ergocalciferol)  50,000 Units Oral Q7 days   . sodium chloride 75 mL/hr (10/14/14 1717)    Allergies: No Known Allergies  Social History   Social History  . Marital Status: Married    Spouse Name: N/A  . Number of Children: N/A  . Years of Education: N/A   Occupational History  . Not on file.   Social History Main Topics  . Smoking status: Never Smoker   . Smokeless tobacco: Not on file  . Alcohol Use: Not on file  . Drug Use: Not on file  . Sexual Activity: Not on file   Other Topics Concern  . Not on file   Social History Narrative     History reviewed. No pertinent family history.   Review of Systems: Review of Systems  Unable to perform ROS: patient nonverbal    Labs:  Recent Labs  10/14/14 0604 10/14/14 1903 10/14/14 2244  TROPONINI <0.03 <0.03 <0.03   Lab Results  Component Value Date   WBC 6.4 10/14/2014   HGB 12.3* 10/14/2014   HCT 38.0* 10/14/2014   MCV 90.8 10/14/2014   PLT 187 10/14/2014     Recent Labs Lab 10/14/14 0604  NA 139  K 3.9  CL 105  CO2 21*  BUN 21*  CREATININE 0.90  CALCIUM 8.8*  PROT 6.7  BILITOT 0.4  ALKPHOS 57  ALT 16*  AST 32  GLUCOSE 177*    No results found for: CHOL, HDL, LDLCALC, TRIG No results found for: DDIMER  Radiology/Studies:  Ct Head Wo Contrast  10/14/2014   CLINICAL DATA:  Muscle rigidity and seizure like activity. History of bladder cancer and seizures.  EXAM: CT HEAD WITHOUT CONTRAST  TECHNIQUE: Contiguous axial images were obtained from the base of the skull through the vertex without intravenous contrast.  COMPARISON:  CT head January 05, 2013  FINDINGS: No intraparenchymal hemorrhage, mass effect, midline shift or acute large vascular territory  infarct. Moderate to severe ventriculomegaly on the basis of global parenchymal brain volume loss. Confluent supratentorial white matter hypodensities again noted. 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  No abnormal extra-axial fluid collections. Moderate calcific atherosclerosis of the carotid siphons. Basal cisterns are patent. Included ocular globes and orbital contents are unremarkable. No skull fracture.  IMPRESSION: No acute intracranial process.  Moderate to severe global brain atrophy. Severe white matter changes compatible with chronic small vessel ischemic disease. 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   Electronically Signed   By: Elon Alas M.D.   On: 10/14/2014 06:47   Dg Chest Port 1 View  10/14/2014   CLINICAL DATA:  Possible seizure.  EXAM: PORTABLE CHEST - 1 VIEW  COMPARISON:  Single view of the chest 01/05/2013.  FINDINGS: The lungs are clear. Heart size is upper normal. No pneumothorax or pleural effusion is identified. Aortic atherosclerosis is noted.  IMPRESSION: No acute disease.   Electronically Signed   By: Inge Rise M.D.   On: 10/14/2014 07:18    EKG: Afib with RVR, 114 BPM, QTc 482, no significant st/t changes    Tele: Sinus brady, HR in 50's, brief asystole overnight x1 (10/15/14)  Weights: Filed Weights   10/14/14 0554 10/14/14 1555  Weight: 176 lb 9.6 oz (80.105 kg) 161 lb 3.2 oz  (73.12 kg)     Physical Exam: Blood pressure 130/81, pulse 58, temperature 97.9 F (36.6 C), temperature source Oral, resp. rate 18, height 5\' 8"  (1.727 m), weight 161 lb 3.2 oz (73.12 kg), SpO2 100 %. Body mass index is 24.52 kg/(m^2). General: Well developed, well nourished, in no acute distress. Minimally responds to stimuli.   Head: Normocephalic, atraumatic, sclera non-icteric, no xanthomas, nares are without discharge.  Neck: Negative for carotid bruits. JVD not elevated. Lungs: Clear bilaterally to auscultation without wheezes, rales, or rhonchi. Breathing is unlabored. Heart: RRR with S1 S2. No murmurs, rubs, or gallops appreciated. Abdomen: Soft, non-tender, non-distended with normoactive bowel sounds. No hepatomegaly. No rebound/guarding. No obvious abdominal masses. Msk:  Strength and tone appear normal for age. Extremities: No clubbing or cyanosis. No edema.  Distal pedal pulses are 2+ and equal bilaterally. Neuro: Turns head to food and follows commands, moves arms/legs purposefully, but does not speak or turn head to someone talking to him. No facial asymmetry. No focal deficit. Moves all extremities spontaneously. Psych:  Unable to respond to questions d/t dementia.    Assessment and Plan:   1. New onset Afib with RVR: -Currently in sinus rhythm in sinus bradycardia in the 50s -Likely in the setting of seizure-like activity -Check echo to evaluate LV function -Continue to monitor on tele -Would be unable to add a BB d/t soft BP and bradycardia -Unable to start full dose anticoagulation given his unsteady gait and seizure-like activity at baseline making him at increased of falls and head trauma  -CHADSVASc at least 2 (DM, age) -Would aim to address bigger picture with this gentleman   2.Hypotension: -Improved -BP's running 01-093 systolic, 23-55 diastolic -Most recent reading 130/81 after receiving NS -Continue to monitor  3. Seizure:  -Per IM, neuro  consulted -Recommended keppra 500 q12 on 8/15 without any loading dose  4. Hyperlipidemia -Continue simvastatin  5. DM: -Per IM -Hba1c 6.4, on metformin   Melvern Banker, PA-C Pager: [phone] 10/15/2014, 8:49 AM  Attending Note:   The patient was seen and examined.  Agree with assessment and plan as noted above.  Changes made to the above note as needed.  1.  Paroxysmal Atrial fib:    The patient is severely limited from his medical issues. He apparently has a history of present severe dementia and is nonverbal. He is usually cared for by his wife. His wife went to [place] for an extended vacation and left him in a nursing home.  He was admitted with a seizure. He was noted to have atrial fibrillation.  He has now converted to sinus rhythm and his heart rate ranges from 58-62. He's in normal sinus rhythm.  In my opinion I do not think that he needs any additional medications. He has paroxysmal atrial fibrillation with a peak heart rate in the 120 to 130 range.  I do not think that he needs any  beta blocker or calcium channel blocker since his heart rate at baseline is no low normal range.  In addition, think that he is a very poor candidate for any coagulation since he has a history of severe dementia and also has seizures. He certain he would be at risk of falling and hitting his head.  At this point I do not think that he needs any additional workup. I do not think that it would change our  management.   He needs care concentrating on safety and comfort but does not need any additional cardiology workup.   We will sign off.    Thayer Headings, Brooke Bonito., MD, [facility] 10/15/2014, 10:25 AM [address]  [place] Pager [phone]

## 2014-10-15 NOTE — Progress Notes (Signed)
Patient lying in bed, no distress. Will prepare discharge paper work

## 2014-10-15 NOTE — Clinical Social Work Note (Signed)
Patient sent last evening from [facility] for new onset seizure. MD to discharge patient back to [facility] today. CSW beside MD when he contacted patient's daughter in law to notify of discharge and she was in agreement. CSW contacted Anderson Malta at [facility] and they stated patient could return today via EMS.  Shela Leff MSW,LCSW [phone]

## 2014-10-15 NOTE — Plan of Care (Signed)
Problem: Consults Goal: PEDS/Adult Seizures Patient Education See Patient Education Module for education specifics.  Outcome: Not Met (add Reason) Unable to educate due to patient's loc

## 2014-10-15 NOTE — Plan of Care (Signed)
Problem: Consults Goal: PEDS/Adult Seizures Patient Education See Patient Education Module for education specifics.  Outcome: Not Met (add Reason) Unable to teach due to loc     

## 2014-10-15 NOTE — Progress Notes (Signed)
Report called in to [facility] [phone]. Packet to be given to EMS.  Discharge instructions in packet informed nurse that follow ups need to be scheduled. Will remove telemetry and IV, no seizure activity noted, transported on room air.

## 2014-10-15 NOTE — Discharge Summary (Signed)
[facility] [place] NAME: Tyler Cordova    MR#:  [id number]  DATE OF BIRTH:  [birth year]  DATE OF ADMISSION:  10/14/2014 ADMITTING PHYSICIAN: Max Sane, MD  DATE OF DISCHARGE: 10/15/2014  PRIMARY CARE PHYSICIAN: Lenard Simmer, MD    ADMISSION DIAGNOSIS:  Seizure [R56.9] New onset atrial fibrillation [I48.91]  DISCHARGE DIAGNOSIS:  Active Problems:   Seizures   Diabetes mellitus without complication   Hyperlipidemia  transient atrial fibrillation, now back in normal sinus rhythm - controlled rate SECONDARY DIAGNOSIS:   Past Medical History  Diagnosis Date  . Seizures     Per family, patient has epileptic seizures; but he does not take medicines for it;   . Alzheimer's dementia     Rapidly progressive Alzheimer's Dementia. Still at home but now with increasing behavioral problems. Uses Diapers for Bowel and Urinary leakage. Wife normally takes care of him at home but she and most of his family are in [place] for an extended period of time so he is staying at [facility] assisted living for time being  . Diabetes mellitus without complication     Dx 03/19/1476, followed with Delrae Rend MD   . 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 Hyperlipidemia     On statin; dx 10/27/2012, followed with Delrae Rend MD    . Ureteral cancer     Bilateral; followed by Ivin Booty, MD([facility])  . Dementia     HOSPITAL COURSE:  74 y.o. male with a known history of seizures, dementia, DM was brought to the ED via EMS from [place] for seizure.  Patient did not have any further seizure while in the hospital.  Neurology consultation was obtained with Dr. Irish Elders who recommended starting Keppra if family is in agreement.  Merocel.  Patient's family is out of country in [place] for vacation.  I was able to get in touch with his daughter-in-law who are  discussion with patient's son and they did not want to start any new or medications.  Patient had a new onset of atrial fibrillation while in the ED which was transient and he was converted back to normal sinus rhythm was evaluated by cardiology who recommended starting on a medication. Patient's mental status is back to his baseline and did not have any further seizure while in the hospital and is being discharged back to [facility] in stable condition.    I have discussed with patient's daughter-in-law that if family chooses and if patient has any more seizure while at [facility].  They can certainly consider starting Keppra, but at this point we will hold off.  Therefore in agreement with the discharge plan is being discharged back to [facility] in stable condition. DISCHARGE CONDITIONS:  Stable  CONSULTS OBTAINED:  Treatment Team:  Wellington Hampshire, MD Leotis Pain, MD DRUG ALLERGIES:  No Known Allergies DISCHARGE MEDICATIONS:   Current Discharge Medication List    CONTINUE these medications which have NOT CHANGED   Details  aspirin 81 MG chewable tablet Chew 81 mg by mouth daily.    metFORMIN (GLUCOPHAGE) 500 MG tablet Take 500 mg by mouth 2 (two) times daily.    simvastatin (ZOCOR) 20 MG tablet Take 20 mg by mouth at bedtime.    tamsulosin (FLOMAX) 0.4 MG CAPS capsule Take 0.4 mg by mouth daily.    Vitamin D, Ergocalciferol, (DRISDOL) 50000 UNITS CAPS  capsule Take 50,000 Units by mouth every 7 (seven) days.         DISCHARGE INSTRUCTIONS:   DIET:  Cardiac diet DISCHARGE CONDITION:  Good ACTIVITY:  Activity as tolerated OXYGEN:  Home Oxygen: No.  Oxygen Delivery: room air DISCHARGE LOCATION:  nursing home   If you experience worsening of your admission symptoms, develop shortness of breath, life threatening emergency, suicidal or homicidal thoughts you must seek medical attention immediately by calling 911 or calling your MD immediately  if symptoms  less severe.  You Must read complete instructions/literature along with all the possible adverse reactions/side effects for all the Medicines you take and that have been prescribed to you. Take any new Medicines after you have completely understood and accpet all the possible adverse reactions/side effects.   Please note  You were cared for by a hospitalist during your hospital stay. If you have any questions about your discharge medications or the care you received while you were in the hospital after you are discharged, you can call the unit and asked to speak with the hospitalist on call if the hospitalist that took care of you is not available. Once you are discharged, your primary care physician will handle any further medical issues. Please note that NO REFILLS for any discharge medications will be authorized once you are discharged, as it is imperative that you return to your primary care physician (or establish a relationship with a primary care physician if you do not have one) for your aftercare needs so that they can reassess your need for medications and monitor your lab values.    On the day of Discharge: VITAL SIGNS:  Blood pressure 130/81, pulse 58, temperature 97.9 F (36.6 C), temperature source Oral, resp. rate 18, height 5\' 8"  (1.727 m), weight 73.12 kg (161 lb 3.2 oz), SpO2 100 %. PHYSICAL EXAMINATION:  GENERAL:  74 y.o.-year-old patient lying in the bed with no acute distress.  EYES: Pupils equal, round, reactive to light and accommodation. No scleral icterus. Extraocular muscles intact.  HEENT: Head atraumatic, normocephalic. Oropharynx and nasopharynx clear.  NECK:  Supple, no jugular venous distention. No thyroid enlargement, no tenderness.  LUNGS: Normal breath sounds bilaterally, no wheezing, rales,rhonchi or crepitation. No use of accessory muscles of respiration.  CARDIOVASCULAR: S1, S2 normal. No murmurs, rubs, or gallops.  ABDOMEN: Soft, non-tender, non-distended.  Bowel sounds present. No organomegaly or mass.  EXTREMITIES: No pedal edema, cyanosis, or clubbing.  NEUROLOGIC: Opens eyes to voice, Follows me around room, Does not follow commands which is Baseline according to family, Symmetrical motor strength bilaterally, Responds to visual threats.  PSYCHIATRIC: The patient is alert but not oriented SKIN: No obvious rash, lesion, or ulcer.  DATA REVIEW:   CBC  Recent Labs Lab 10/14/14 0604  WBC 6.4  HGB 12.3*  HCT 38.0*  PLT 187    Chemistries   Recent Labs Lab 10/14/14 0604  NA 139  K 3.9  CL 105  CO2 21*  GLUCOSE 177*  BUN 21*  CREATININE 0.90  CALCIUM 8.8*  AST 32  ALT 16*  ALKPHOS 57  BILITOT 0.4   RADIOLOGY:  Ct Head Wo Contrast  10/14/2014   CLINICAL DATA:  Muscle rigidity and seizure like activity. History of bladder cancer and seizures.  EXAM: CT HEAD WITHOUT CONTRAST  TECHNIQUE: Contiguous axial images were obtained from the base of the skull through the vertex without intravenous contrast.  COMPARISON:  CT head January 05, 2013  FINDINGS: No intraparenchymal hemorrhage,  mass effect, midline shift or acute large vascular territory infarct. Moderate to severe ventriculomegaly on the basis of global parenchymal brain volume loss. Confluent supratentorial white matter hypodensities again noted. 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  No abnormal extra-axial fluid collections. Moderate calcific atherosclerosis of the carotid siphons. Basal cisterns are patent. Included ocular globes and orbital contents are unremarkable. No skull fracture.  IMPRESSION: No acute intracranial process.  Moderate to severe global brain atrophy. Severe white matter changes compatible with chronic small vessel ischemic disease. 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   Electronically Signed   By: Elon Alas M.D.   On: 10/14/2014 06:47   Dg Chest Port 1 View  10/14/2014   CLINICAL DATA:  Possible  seizure.  EXAM: PORTABLE CHEST - 1 VIEW  COMPARISON:  Single view of the chest 01/05/2013.  FINDINGS: The lungs are clear. Heart size is upper normal. No pneumothorax or pleural effusion is identified. Aortic atherosclerosis is noted.  IMPRESSION: No acute disease.   Electronically Signed   By: Inge Rise M.D.   On: 10/14/2014 07:18   Management plans discussed with the patient, family and they are in agreement.  CODE STATUS: Full code   TOTAL TIME TAKING CARE OF THIS PATIENT: 55 minutes.    [facility], Layth Cerezo M.D on 10/15/2014 at 11:02 AM  Between 7am to 6pm - Pager - [phone]  After 6pm go to [email or web address] - password [email or web address] [facility]  Office  [phone]  CC: Primary care physician; Lenard Simmer, MD

## 2014-10-18 ENCOUNTER — Other Ambulatory Visit: Payer: Self-pay

## 2014-10-22 ENCOUNTER — Other Ambulatory Visit: Payer: Self-pay

## 2014-10-22 DIAGNOSIS — I351 Nonrheumatic aortic (valve) insufficiency: Secondary | ICD-10-CM

## 2014-12-09 ENCOUNTER — Other Ambulatory Visit: Payer: Self-pay | Admitting: Endocrinology

## 2014-12-09 DIAGNOSIS — I471 Supraventricular tachycardia: Secondary | ICD-10-CM

## 2014-12-12 ENCOUNTER — Ambulatory Visit
Admission: RE | Admit: 2014-12-12 | Discharge: 2014-12-12 | Disposition: A | Discharge status: Home / Self Care | Payer: Medicare PPO | Source: Ambulatory Visit | Attending: Endocrinology | Admitting: Endocrinology

## 2014-12-12 DIAGNOSIS — I471 Supraventricular tachycardia: Secondary | ICD-10-CM | POA: Diagnosis not present

## 2014-12-12 NOTE — Progress Notes (Signed)
*  PRELIMINARY RESULTS* Echocardiogram 2D Echocardiogram has been performed.  Tyler Cordova 12/12/2014, 10:47 AM

## 2014-12-24 ENCOUNTER — Emergency Department: Payer: Medicare PPO

## 2014-12-24 ENCOUNTER — Inpatient Hospital Stay
Admission: EM | Admit: 2014-12-24 | Discharge: 2014-12-27 | DRG: 065 | Disposition: A | Discharge status: Skilled Nursing Facility | Payer: Medicare PPO | Attending: Internal Medicine | Admitting: Internal Medicine

## 2014-12-24 ENCOUNTER — Encounter: Payer: Self-pay | Admitting: Emergency Medicine

## 2014-12-24 DIAGNOSIS — F0280 Dementia in other diseases classified elsewhere without behavioral disturbance: Secondary | ICD-10-CM | POA: Diagnosis present

## 2014-12-24 DIAGNOSIS — Z7982 Long term (current) use of aspirin: Secondary | ICD-10-CM

## 2014-12-24 DIAGNOSIS — I63511 Cerebral infarction due to unspecified occlusion or stenosis of right middle cerebral artery: Secondary | ICD-10-CM | POA: Diagnosis present

## 2014-12-24 DIAGNOSIS — Z811 Family history of alcohol abuse and dependence: Secondary | ICD-10-CM

## 2014-12-24 DIAGNOSIS — I63311 Cerebral infarction due to thrombosis of right middle cerebral artery: Secondary | ICD-10-CM | POA: Diagnosis not present

## 2014-12-24 DIAGNOSIS — I480 Paroxysmal atrial fibrillation: Secondary | ICD-10-CM | POA: Diagnosis present

## 2014-12-24 DIAGNOSIS — E119 Type 2 diabetes mellitus without complications: Secondary | ICD-10-CM | POA: Diagnosis present

## 2014-12-24 DIAGNOSIS — G309 Alzheimer's disease, unspecified: Secondary | ICD-10-CM | POA: Diagnosis present

## 2014-12-24 DIAGNOSIS — N390 Urinary tract infection, site not specified: Secondary | ICD-10-CM | POA: Diagnosis present

## 2014-12-24 DIAGNOSIS — N400 Enlarged prostate without lower urinary tract symptoms: Secondary | ICD-10-CM | POA: Diagnosis present

## 2014-12-24 DIAGNOSIS — I639 Cerebral infarction, unspecified: Secondary | ICD-10-CM

## 2014-12-24 DIAGNOSIS — E785 Hyperlipidemia, unspecified: Secondary | ICD-10-CM | POA: Diagnosis present

## 2014-12-24 DIAGNOSIS — Z7984 Long term (current) use of oral hypoglycemic drugs: Secondary | ICD-10-CM | POA: Diagnosis not present

## 2014-12-24 DIAGNOSIS — G8194 Hemiplegia, unspecified affecting left nondominant side: Secondary | ICD-10-CM | POA: Diagnosis present

## 2014-12-24 DIAGNOSIS — I63312 Cerebral infarction due to thrombosis of left middle cerebral artery: Secondary | ICD-10-CM | POA: Diagnosis not present

## 2014-12-24 DIAGNOSIS — C669 Malignant neoplasm of unspecified ureter: Secondary | ICD-10-CM | POA: Diagnosis present

## 2014-12-24 DIAGNOSIS — Z79899 Other long term (current) drug therapy: Secondary | ICD-10-CM

## 2014-12-24 DIAGNOSIS — R531 Weakness: Secondary | ICD-10-CM

## 2014-12-24 DIAGNOSIS — C679 Malignant neoplasm of bladder, unspecified: Secondary | ICD-10-CM | POA: Diagnosis present

## 2014-12-24 DIAGNOSIS — G40909 Epilepsy, unspecified, not intractable, without status epilepticus: Secondary | ICD-10-CM | POA: Diagnosis present

## 2014-12-24 DIAGNOSIS — R569 Unspecified convulsions: Secondary | ICD-10-CM

## 2014-12-24 DIAGNOSIS — M6289 Other specified disorders of muscle: Secondary | ICD-10-CM | POA: Diagnosis present

## 2014-12-24 LAB — CBC
HEMATOCRIT: 38.5 % — AB (ref 40.0–52.0)
HEMOGLOBIN: 12.7 g/dL — AB (ref 13.0–18.0)
MCH: 29 pg (ref 26.0–34.0)
MCHC: 33.1 g/dL (ref 32.0–36.0)
MCV: 87.7 fL (ref 80.0–100.0)
Platelets: 320 10*3/uL (ref 150–440)
RBC: 4.39 MIL/uL — AB (ref 4.40–5.90)
RDW: 14.4 % (ref 11.5–14.5)
WBC: 7.4 10*3/uL (ref 3.8–10.6)

## 2014-12-24 LAB — BASIC METABOLIC PANEL
Anion gap: 9 (ref 5–15)
BUN: 16 mg/dL (ref 6–20)
CALCIUM: 8.7 mg/dL — AB (ref 8.9–10.3)
CO2: 24 mmol/L (ref 22–32)
Chloride: 104 mmol/L (ref 101–111)
Creatinine, Ser: 1.04 mg/dL (ref 0.61–1.24)
GFR calc Af Amer: >60 mL/min (ref >60)
Glucose, Bld: 135 mg/dL — ABNORMAL HIGH (ref 65–99)
POTASSIUM: 4.1 mmol/L (ref 3.5–5.1)
SODIUM: 137 mmol/L (ref 135–145)

## 2014-12-24 LAB — GLUCOSE, CAPILLARY: Glucose-Capillary: 75 mg/dL (ref 65–99)

## 2014-12-24 MED ORDER — ONDANSETRON HCL 4 MG/2ML IJ SOLN: 4.0000 mg | Freq: Four times a day (QID) | INTRAMUSCULAR | Status: DC | PRN

## 2014-12-24 MED ORDER — ACETAMINOPHEN 650 MG RE SUPP: 650.0000 mg | Freq: Four times a day (QID) | RECTAL | Status: DC | PRN

## 2014-12-24 MED ORDER — ACETAMINOPHEN 325 MG PO TABS: 650.0000 mg | ORAL_TABLET | Freq: Four times a day (QID) | ORAL | Status: DC | PRN

## 2014-12-24 MED ORDER — ASPIRIN 325 MG PO TABS
325.0000 mg | ORAL_TABLET | Freq: Every day | ORAL | Status: DC
  Administered 2014-12-25 – 2014-12-26 (×2): 325 mg via ORAL
  Filled 2014-12-24 (×2): qty 1

## 2014-12-24 MED ORDER — VITAMIN D (ERGOCALCIFEROL) 1.25 MG (50000 UNIT) PO CAPS: 50000.0000 [IU] | ORAL_CAPSULE | ORAL | Status: DC

## 2014-12-24 MED ORDER — POLYETHYLENE GLYCOL 3350 17 G PO PACK: 17.0000 g | PACK | Freq: Every day | ORAL | Status: DC | PRN

## 2014-12-24 MED ORDER — LORAZEPAM 2 MG/ML IJ SOLN
1.0000 mg | Freq: Four times a day (QID) | INTRAMUSCULAR | Status: DC | PRN
  Filled 2014-12-24: qty 1

## 2014-12-24 MED ORDER — TAMSULOSIN HCL 0.4 MG PO CAPS
0.4000 mg | ORAL_CAPSULE | Freq: Every day | ORAL | Status: DC
  Administered 2014-12-25 – 2014-12-27 (×3): 0.4 mg via ORAL
  Filled 2014-12-24 (×3): qty 1

## 2014-12-24 MED ORDER — ONDANSETRON HCL 4 MG PO TABS
4.0000 mg | ORAL_TABLET | Freq: Four times a day (QID) | ORAL | Status: DC | PRN
Start: 2014-12-24 — End: 2014-12-27

## 2014-12-24 MED ORDER — HYDROCODONE-ACETAMINOPHEN 5-325 MG PO TABS
1.0000 | ORAL_TABLET | ORAL | Status: DC | PRN
  Filled 2014-12-24: qty 2

## 2014-12-24 MED ORDER — ENOXAPARIN SODIUM 40 MG/0.4ML ~~LOC~~ SOLN
40.0000 mg | SUBCUTANEOUS | Status: DC
  Administered 2014-12-25 – 2014-12-26 (×2): 40 mg via SUBCUTANEOUS
  Filled 2014-12-24 (×3): qty 0.4

## 2014-12-24 MED ORDER — ADULT MULTIVITAMIN W/MINERALS CH
1.0000 | ORAL_TABLET | Freq: Every day | ORAL | Status: DC
  Administered 2014-12-25 – 2014-12-27 (×3): 1 via ORAL
  Filled 2014-12-24 (×3): qty 1

## 2014-12-24 MED ORDER — METFORMIN HCL 500 MG PO TABS
500.0000 mg | ORAL_TABLET | Freq: Two times a day (BID) | ORAL | Status: DC
  Filled 2014-12-24: qty 1

## 2014-12-24 MED ORDER — SIMVASTATIN 20 MG PO TABS
20.0000 mg | ORAL_TABLET | Freq: Every day | ORAL | Status: DC
  Administered 2014-12-25 – 2014-12-26 (×2): 20 mg via ORAL
  Filled 2014-12-24 (×2): qty 1

## 2014-12-24 MED ORDER — VITAMIN B-12 1000 MCG PO TABS
1000.0000 ug | ORAL_TABLET | Freq: Every day | ORAL | Status: DC
  Administered 2014-12-25 – 2014-12-27 (×3): 1000 ug via ORAL
  Filled 2014-12-24 (×3): qty 1

## 2014-12-24 NOTE — ED Notes (Addendum)
Family reports starting last Tuesday pt not using left arm and having difficulty walking; Family reports pt having increased mobility throughout week. Pt was able to transfer from wheel chair to bed, with 1x assist. Pt not following commands, left arm still not moving.

## 2014-12-24 NOTE — H&P (Signed)
[facility] at [place] NAME: Tyler Cordova    MR#:  [id number]  DATE OF BIRTH:  05-07-1940  DATE OF ADMISSION:  12/24/2014  PRIMARY CARE PHYSICIAN: Lenard Simmer, MD   REQUESTING/REFERRING PHYSICIAN: Dr. Larae Grooms  CHIEF COMPLAINT:   Chief Complaint  Patient presents with  . Weakness    HISTORY OF PRESENT ILLNESS:  Tyler Cordova  is a 74 y.o. male with a known history of seizure disorder, not on any antiepileptics, severe Alzheimer's dementia with nonverbal at baseline, newly diagnosed bladder cancer undergoing treatment brought in from neurology office for left-sided weakness. Patient is unable to provide any history, his son at bedside and provides most of the history. And history is also obtained from Dr. Guinevere Scarlet notes. Patient was seen by neurologist in the office about 2 months ago for his ongoing seizures, at that time decision was made not to start any antiepileptics on him. According to son patient seizures are followed by a period of decreased responsiveness which recovers after 24-48 hours. Last week, patient woke up and was noticed to be weaker on his left side as he was dragging his left side to walk. Normally his gait is unsteady, but he cruises around the house without any difficulty. He was also noticed to be weaker on his left arm. Patient is a right-handed person. So the family thought maybe he has had a seizure in sleep, and waited for his symptoms to improve. After a couple of days he started regaining function of his left side but is not back to normal. He's been noted to have fluctuating weakness on that side. No obvious seizures noted by the family. Since his weakness persisted even after a week, he was taken to neurology office and was recommended to come to the emergency room. According to family, patient has been eating without any signs of dysphagia. CT of the head here shows severe generalized  atrophy of his brain but no acute infarct.  PAST MEDICAL HISTORY:   Past Medical History  Diagnosis Date  . 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 Alzheimer's dementia     Rapidly progressive Alzheimer's Dementia. Still at home but now with increasing behavioral problems. Uses Diapers for Bowel and Urinary leakage. Wife normally takes care of him at home but she and most of his family are in [place] for an extended period of time so he is staying at [facility] assisted living for time being  . Diabetes mellitus without complication ([place])     Dx 10/27/2012, followed with Delrae Rend MD   . Bladder cancer [facility])     Multifocal Low-Grade Transitional Cell Carcinoma; followed by Ivin Booty, MD([facility])  . Hyperlipidemia     On statin; dx 10/27/2012, followed with Delrae Rend MD    . Ureteral cancer [facility])     Bilateral; followed by Ivin Booty, MD([facility])  . Dementia     PAST SURGICAL HISTORY:   Past Surgical History  Procedure Laterality Date  . Ureteroscopies      for bladder cancer    SOCIAL HISTORY:   Social History  Substance Use Topics  . Smoking status: Never Smoker   . Smokeless tobacco: Not on file  . Alcohol Use: No    FAMILY HISTORY:   Family History  Problem Relation Age of Onset  . Cirrhosis Father  Father was an alcoholic    DRUG ALLERGIES:  No Known Allergies  REVIEW OF SYSTEMS:   Review of Systems  Unable to perform ROS: dementia    MEDICATIONS AT HOME:   Prior to Admission medications   Medication Sig Start Date End Date Taking? Authorizing Provider  aspirin 325 MG tablet Take 325 mg by mouth at bedtime.   Yes Historical Provider, MD  metFORMIN (GLUCOPHAGE) 500 MG tablet Take 500 mg by mouth 2 (two) times daily with a meal.    Yes Historical Provider, MD  Multiple Vitamin (MULTIVITAMIN WITH MINERALS) TABS tablet Take 1 tablet by mouth daily.   Yes  Historical Provider, MD  simvastatin (ZOCOR) 20 MG tablet Take 20 mg by mouth at bedtime.   Yes Historical Provider, MD  tamsulosin (FLOMAX) 0.4 MG CAPS capsule Take 0.4 mg by mouth daily.   Yes Historical Provider, MD  vitamin B-12 (CYANOCOBALAMIN) 1000 MCG tablet Take 1,000 mcg by mouth daily.   Yes Historical Provider, MD  Vitamin D, Ergocalciferol, (DRISDOL) 50000 UNITS CAPS capsule Take 50,000 Units by mouth every 7 (seven) days. Pt takes on Sunday.   Yes Historical Provider, MD      VITAL SIGNS:  Blood pressure 100/61, pulse 56, temperature 98.2 F (36.8 C), temperature source Axillary, resp. rate 16, weight 73.029 kg (161 lb), SpO2 100 %.  PHYSICAL EXAMINATION:   Physical Exam  GENERAL:  74 y.o.-year-old patient lying in the bed with no acute distress.  EYES: Pupils equal, round, reactive to light and accommodation. No scleral icterus. Extraocular muscles intact.  HEENT: Head atraumatic, normocephalic. Oropharynx and nasopharynx clear. No facial droop noted. NECK:  Supple, no jugular venous distention. No thyroid enlargement, no tenderness.  LUNGS: Normal breath sounds bilaterally, no wheezing, rales,rhonchi or crepitation. No use of accessory muscles of respiration.  CARDIOVASCULAR: S1, S2 normal. No murmurs, rubs, or gallops.  ABDOMEN: Soft, nontender, nondistended. Bowel sounds present. No organomegaly or mass.  EXTREMITIES: No pedal edema, cyanosis, or clubbing.  NEUROLOGIC: Tyler Cordova is not following commands. It's hard to do a complete neuro exam. No facial droop noted. His right-sided strength is 5 out of 5 both upper and lower extremities. Left-sided arm he is mowing, has good strength in the proximal portion but has a very weak hand grip. Seems like his tone is increased on the left side, especially noted in the left lower extremity.  Sensation intact. Gait not checked.  Nonverbal at baseline, sometimes makes incomprehensible sounds PSYCHIATRIC: The patient is alert and not  oriented.  SKIN: No obvious rash, lesion, or ulcer.   LABORATORY PANEL:   CBC  Recent Labs Lab 12/24/14 1521  WBC 7.4  HGB 12.7*  HCT 38.5*  PLT 320   ------------------------------------------------------------------------------------------------------------------  Chemistries   Recent Labs Lab 12/24/14 1521  NA 137  K 4.1  CL 104  CO2 24  GLUCOSE 135*  BUN 16  CREATININE 1.04  CALCIUM 8.7*   ------------------------------------------------------------------------------------------------------------------  Cardiac Enzymes No results for input(s): TROPONINI in the last 168 hours. ------------------------------------------------------------------------------------------------------------------  RADIOLOGY:  Ct Head Wo Contrast  12/24/2014  CLINICAL DATA:  Left upper extremity weakness EXAM: CT HEAD WITHOUT CONTRAST TECHNIQUE: Contiguous axial images were obtained from the base of the skull through the vertex without intravenous contrast. COMPARISON:  10/14/2014 FINDINGS: Skull and Sinuses:Negative for fracture or destructive process. The mastoids, middle ears, and imaged paranasal sinuses are clear. Orbits: No acute abnormality. Brain: No explanation for left upper extremity weakness. No evidence of acute infarct, hemorrhage, hydrocephalus,  or mass lesion. Small-vessel ischemic changes in the right thalamus and bilateral basal ganglia have a stable appearance compared to prior. Ischemic gliosis is confluent throughout the bilateral cerebral white matter. Advanced generalized atrophy with prominent mesial temporal involvement correlating with history of Alzheimer's disease. IMPRESSION: 1. No acute finding. 2. Atrophy and chronic ischemic injury as described above. Electronically Signed   By: Monte Fantasia M.D.   On: 12/24/2014 16:52    EKG:   Orders placed or performed during the hospital encounter of 12/24/14  . EKG 12-Lead  . EKG 12-Lead  . ED EKG  . ED EKG     IMPRESSION AND PLAN:   Egor Fullilove  is a 74 y.o. male with a known history of seizure disorder, not on any antiepileptics, severe Alzheimer's dementia with nonverbal at baseline, newly diagnosed bladder cancer undergoing treatment brought in from neurology office for left-sided weakness.  #1 left-sided weakness- Improving, varying muscle tone on exam - ? Todd's palsy following seizure- but prolonged post -ictal - CT head with no acute infarct - MRI brain with and without contrast, neuro checks - neurology consult - Physical therapy consult and possible placement  #2 seizure disorder- not on antiepileptics, not sure if having subclinical seizures at times and family unable to recognize - EEG in am - neuro consult to see if he could benefit from anti epileptics  #3 severe dementia-seems to be at baseline - Ct head with advanced atrophy with temporal lobe involvement - had reaction to namenda in the past  #4 diabetes mellitus-on metformin and SSI  #5 bladder cancer- Following at [facility] for intravesicular treatments - outpatient follow up recommedned  #6 benign prostatitic hypertrophy- continue flomax  #7 DVT prophylaxis- On lovenox   All the records are reviewed and case discussed with ED provider. Management plans discussed with the patient, family and they are in agreement.  CODE STATUS: Full Code  TOTAL TIME TAKING CARE OF THIS PATIENT: 50 minutes.    Consandra Laske M.D on 12/24/2014 at 8:17 PM  Between 7am to 6pm - Pager - [phone]  After 6pm go to [email or web address] - password [email or web address] [facility]  Office  [phone]  CC: Primary care physician; Lenard Simmer, MD

## 2014-12-24 NOTE — ED Notes (Signed)
Pt to ed with family who reports pt had CVA last week.  Pt son states last week on Tuesday pt awoke with left sided weakness and was subsequently dx with CVA.  Pt with hx of alzheimers and dementia and was able to ambulate but after Tuesday is no longer able to ambulate and therefore pt wife is unable to care for him at home.  Pt was taken to see neurologist today and was told to come to ed for admission and possible placement for rehab of left side.

## 2014-12-24 NOTE — ED Provider Notes (Signed)
[facility] Emergency Department Provider Note  ____________________________________________  Time seen: Approximately 409 PM  I have reviewed the triage vital signs and the nursing notes.   HISTORY  Chief Complaint Weakness    HPI Tyler Cordova is a 74 y.o. male with a history of dementia, seizure who is presenting today with left upper extremity paralysis. The patient is nonverbal and so the history was taken through the patient's son. This began last Tuesday morning when the patient awoke with this weakness. He has had some improvement since then. The family felt that this was secondary to a seizure. They say he has had similar presentations in the past. However, since last Tuesday morning the patient has not been able to walk and has not been moving his left hand. He does move the left arm at the shoulder. However, prior to this past Tuesday the patient had full and equal range of motion of the bilateral upper extremities. The patient and his family were sent to the emergency department today for evaluation for stroke and likely admission for this.   Past Medical History  Diagnosis Date  . Seizures ([place])     Per family, patient has epileptic seizures; but he does not take medicines for it;   . Alzheimer's dementia     Rapidly progressive Alzheimer's Dementia. Still at home but now with increasing behavioral problems. Uses Diapers for Bowel and Urinary leakage. Wife normally takes care of him at home but she and most of his family are in [place] for an extended period of time so he is staying at [facility] assisted living for time being  . Diabetes mellitus without complication ([place])     Dx 10/27/2012, followed with Delrae Rend MD   . Bladder cancer [facility])     Multifocal Low-Grade Transitional Cell Carcinoma; followed by Ivin Booty, MD([facility])  . Hyperlipidemia     On statin; dx 10/27/2012, followed with Delrae Rend MD    . 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 Dementia     Patient Active Problem List   Diagnosis Date Noted  . Ureteral cancer ([place])   . Seizures ([place])   . Alzheimer's dementia   . Diabetes mellitus without complication ([place])   . Bladder cancer ([place])   . Hyperlipidemia     History reviewed. No pertinent past surgical history.  Current Outpatient Rx  Name  Route  Sig  Dispense  Refill  . aspirin 325 MG tablet   Oral   Take 325 mg by mouth at bedtime.         . metFORMIN (GLUCOPHAGE) 500 MG tablet   Oral   Take 500 mg by mouth 2 (two) times daily with a meal.          . Multiple Vitamin (MULTIVITAMIN WITH MINERALS) TABS tablet   Oral   Take 1 tablet by mouth daily.         . simvastatin (ZOCOR) 20 MG tablet   Oral   Take 20 mg by mouth at bedtime.         . tamsulosin (FLOMAX) 0.4 MG CAPS capsule   Oral   Take 0.4 mg by mouth daily.         . vitamin B-12 (CYANOCOBALAMIN) 1000 MCG tablet   Oral   Take 1,000 mcg by mouth daily.         . Vitamin D, Ergocalciferol, (DRISDOL) 50000 UNITS CAPS  capsule   Oral   Take 50,000 Units by mouth every 7 (seven) days. Pt takes on Sunday.           Allergies Review of patient's allergies indicates no known allergies.  History reviewed. No pertinent family history.  Social History Social History  Substance Use Topics  . Smoking status: Never Smoker   . Smokeless tobacco: None  . Alcohol Use: No    Review of Systems Constitutional: No fever/chills Eyes: No visual changes. ENT: No sore throat. Cardiovascular: Denies chest pain. Respiratory: Denies shortness of breath. Gastrointestinal: No abdominal pain.  No nausea, no vomiting.  No diarrhea.  No constipation. Genitourinary: Negative for dysuria. Musculoskeletal: Negative for back pain. Skin: Negative for rash. Neurological: Negative for headaches  10-point ROS otherwise negative. However, the review of systems is confounded  by the patient being nonverbal.  ____________________________________________   PHYSICAL EXAM:  VITAL SIGNS: ED Triage Vitals  Enc Vitals Group     BP 12/24/14 1517 121/55 mmHg     Pulse Rate 12/24/14 1517 69     Resp 12/24/14 1517 20     Temp 12/24/14 1517 98.2 F (36.8 C)     Temp Source 12/24/14 1517 Axillary     SpO2 12/24/14 1517 100 %     Weight 12/24/14 1517 161 lb (73.029 kg)     Height --      Head Cir --      Peak Flow --      Pain Score 12/24/14 1518 0     Pain Loc --      Pain Edu? --      Excl. in [place]? --     Constitutional: Alert  Well appearing and in no acute distress. Eyes: Conjunctivae are normal. PERRL. EOMI. fevers looking to the right side however is able to move his eyes across the midline to the left. Head: Atraumatic. Nose: No congestion/rhinnorhea. Mouth/Throat: Mucous membranes are moist.  Oropharynx non-erythematous. Neck: No stridor.   Cardiovascular: Normal rate, regular rhythm. Grossly normal heart sounds.  Good peripheral circulation. Respiratory: Normal respiratory effort.  No retractions. Lungs CTAB. Gastrointestinal: Soft and nontender. No distention. No abdominal bruits. No CVA tenderness. Musculoskeletal: No lower extremity tenderness nor edema.  No joint effusions. Neurologic:  Patient is nonverbal. Not moving his left upper extremity. Is able to move his bilateral lower extremities. I do notice any facial asymmetry.  Skin:  Skin is warm, dry and intact. No rash noted. Psychiatric: Mood and affect are normal. Speech and behavior are normal.  ____________________________________________   LABS (all labs ordered are listed, but only abnormal results are displayed)  Labs Reviewed  BASIC METABOLIC PANEL - Abnormal; Notable for the following:    Glucose, Bld 135 (*)    Calcium 8.7 (*)    All other components within normal limits  CBC - Abnormal; Notable for the following:    RBC 4.39 (*)    Hemoglobin 12.7 (*)    HCT 38.5 (*)     All other components within normal limits  URINALYSIS COMPLETEWITH MICROSCOPIC ([facility] ONLY)  CBG MONITORING, ED   ____________________________________________  EKG  ED ECG REPORT I, Doran Stabler, the attending physician, personally viewed and interpreted this ECG.   Date: 12/24/2014  EKG Time: 1517  Rate: 68  Rhythm: normal EKG, normal sinus rhythm  Axis: Normal axis  Intervals:none  ST&T Change: No ST elevation or depression. No abnormal T-wave inversion.  ____________________________________________  RADIOLOGY  No acute finding on  the CAT scan of the brain. ____________________________________________   PROCEDURES   ____________________________________________   INITIAL IMPRESSION / ASSESSMENT AND PLAN / ED COURSE  Pertinent labs & imaging results that were available during my care of the patient were reviewed by me and considered in my medical decision making (see chart for details).  ----------------------------------------- 7:07 PM on 12/24/2014 -----------------------------------------  Patient continues to be unchanged neurologically. Discussed with the patient as well as the family the lab as well as CAT scan findings. Less likely to be a stroke after 1 week of symptoms without any CAT scan findings. However, the patient is unable to be cared for at home by the family. His wife is the primary caretaker. The family is questioning of the patient needs rehabilitation as an inpatient which I think is reasonable. The patient will be admitted. Signed out to Dr. Tressia Miners. ____________________________________________   FINAL CLINICAL IMPRESSION(S) / ED DIAGNOSES  Left-sided weakness.    Orbie Pyo, MD 12/24/14 Einar Crow

## 2014-12-25 ENCOUNTER — Inpatient Hospital Stay: Payer: Medicare PPO

## 2014-12-25 DIAGNOSIS — I63312 Cerebral infarction due to thrombosis of left middle cerebral artery: Secondary | ICD-10-CM

## 2014-12-25 LAB — BASIC METABOLIC PANEL
Anion gap: 6 (ref 5–15)
BUN: 15 mg/dL (ref 6–20)
CHLORIDE: 105 mmol/L (ref 101–111)
CO2: 29 mmol/L (ref 22–32)
CREATININE: 0.83 mg/dL (ref 0.61–1.24)
Calcium: 9 mg/dL (ref 8.9–10.3)
GFR calc Af Amer: >60 mL/min (ref >60)
GFR calc non Af Amer: >60 mL/min (ref >60)
GLUCOSE: 103 mg/dL — AB (ref 65–99)
Potassium: 3.6 mmol/L (ref 3.5–5.1)
Sodium: 140 mmol/L (ref 135–145)

## 2014-12-25 LAB — CBC
HCT: 35.1 % — ABNORMAL LOW (ref 40.0–52.0)
Hemoglobin: 12 g/dL — ABNORMAL LOW (ref 13.0–18.0)
MCH: 30 pg (ref 26.0–34.0)
MCHC: 34.3 g/dL (ref 32.0–36.0)
MCV: 87.3 fL (ref 80.0–100.0)
PLATELETS: 294 10*3/uL (ref 150–440)
RBC: 4.01 MIL/uL — ABNORMAL LOW (ref 4.40–5.90)
RDW: 14.7 % — AB (ref 11.5–14.5)
WBC: 7.3 10*3/uL (ref 3.8–10.6)

## 2014-12-25 LAB — GLUCOSE, CAPILLARY
Glucose-Capillary: 92 mg/dL (ref 65–99)
Glucose-Capillary: 142 mg/dL — ABNORMAL HIGH (ref 65–99)
Glucose-Capillary: 103 mg/dL — ABNORMAL HIGH (ref 65–99)

## 2014-12-25 LAB — HEMOGLOBIN A1C: Hgb A1c MFr Bld: 7.6 % — ABNORMAL HIGH (ref 4.0–6.0)

## 2014-12-25 MED ORDER — GADOBENATE DIMEGLUMINE 529 MG/ML IV SOLN
15.0000 mL | Freq: Once | INTRAVENOUS | Status: AC | PRN
Start: 2014-12-25 — End: 2014-12-25
  Administered 2014-12-25: 13 mL via INTRAVENOUS

## 2014-12-25 MED ORDER — INSULIN ASPART 100 UNIT/ML ~~LOC~~ SOLN
0.0000 [IU] | Freq: Three times a day (TID) | SUBCUTANEOUS | Status: DC
  Administered 2014-12-25: 17:00:00 1 [IU] via SUBCUTANEOUS
  Administered 2014-12-26 – 2014-12-27 (×3): 2 [IU] via SUBCUTANEOUS
  Administered 2014-12-27: 17:00:00 1 [IU] via SUBCUTANEOUS
  Filled 2014-12-25: qty 2
  Filled 2014-12-25 (×2): qty 1

## 2014-12-25 MED ORDER — INSULIN ASPART 100 UNIT/ML ~~LOC~~ SOLN: 0.0000 [IU] | Freq: Every day | SUBCUTANEOUS | Status: DC

## 2014-12-25 NOTE — Consult Note (Signed)
CC: L sided weakness  HPI: Tyler Cordova is an 74 y.o. male with a known history of seizure disorder, not on any antiepileptics, severe Alzheimer's dementia with nonverbal at baseline, newly diagnosed bladder cancer undergoing treatment brought in from neurology office for left-sided weakness. It appears that L sided weakness is not acute and has been going on for at least one week.     Past Medical History  Diagnosis Date  . Seizures ([place])     Per family, patient has epileptic seizures; but he does not take medicines for it;   . Alzheimer's dementia     Rapidly progressive Alzheimer's Dementia. Still at home but now with increasing behavioral problems. Uses Diapers for Bowel and Urinary leakage. Wife normally takes care of him at home but she and most of his family are in [place] for an extended period of time so he is staying at [facility] assisted living for time being  . Diabetes mellitus without complication ([place])     Dx 10/27/2012, followed with Delrae Rend MD   . Bladder cancer [facility])     Multifocal Low-Grade Transitional Cell Carcinoma; followed by Ivin Booty, MD([facility])  . Hyperlipidemia     On statin; dx 10/27/2012, followed with Delrae Rend MD    . 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 Dementia     Past Surgical History  Procedure Laterality Date  . Ureteroscopies      for bladder cancer    Family History  Problem Relation Age of Onset  . Cirrhosis Father     Father was an alcoholic    Social History:  reports that he has never smoked. He does not have any smokeless tobacco history on file. He reports that he does not drink alcohol or use illicit drugs.  No Known Allergies  Medications: I have reviewed the patient's current medications.  ROS: Unable to obtain due to poor baseline and dementia   Physical Examination: Blood pressure 106/73, pulse 64, temperature 98.5 F (36.9 C), temperature source  Oral, resp. rate 20, height 5\' 9"  (1.753 m), weight 149 lb 1.6 oz (67.631 kg), SpO2 97 %.    Neurological Examination Mental Status: Non verbal does not follow commands.  Cranial Nerves: II: Discs flat bilaterally; Visual fields grossly normal, pupils equal, round, reactive to light and accommodation III,IV, VI: ptosis not present, extra-ocular motions intact bilaterally V,VII: smile symmetric, facial light touch sensation normal bilaterally VIII: hearing normal bilaterally IX,X: gag reflex present XI: bilateral shoulder shrug XII: midline tongue extension Motor: Right : Upper extremity   4/5    Left:     Upper extremity   2/5 proximally not moving distal part  Lower extremity   4/5     Lower extremity   3+/5 Tone and bulk:normal tone throughout; no atrophy noted Sensory: Pinprick and light touch intact throughout, bilaterally Deep Tendon Reflexes: 2+ and symmetric throughout Cerebellar: Not tested  Gait: not tested.       Laboratory Studies:   Basic Metabolic Panel:  Recent Labs Lab 12/24/14 1521 12/25/14 0426  NA 137 140  K 4.1 3.6  CL 104 105  CO2 24 29  GLUCOSE 135* 103*  BUN 16 15  CREATININE 1.04 0.83  CALCIUM 8.7* 9.0    Liver Function Tests: No results for input(s): AST, ALT, ALKPHOS, BILITOT, PROT, ALBUMIN in the last 168 hours. No results for input(s): LIPASE, AMYLASE in the last 168 hours. No  results for input(s): AMMONIA in the last 168 hours.  CBC:  Recent Labs Lab 12/24/14 1521 12/25/14 0426  WBC 7.4 7.3  HGB 12.7* 12.0*  HCT 38.5* 35.1*  MCV 87.7 87.3  PLT 320 294    Cardiac Enzymes: No results for input(s): CKTOTAL, CKMB, CKMBINDEX, TROPONINI in the last 168 hours.  BNP: Invalid input(s): POCBNP  CBG:  Recent Labs Lab 12/24/14 2059 12/25/14 0729 12/25/14 1633  GLUCAP 75 92 142*    Microbiology: No results found for this or any previous visit.  Coagulation Studies: No results for input(s): LABPROT, INR in the last 72  hours.  Urinalysis: No results for input(s): COLORURINE, LABSPEC, PHURINE, GLUCOSEU, HGBUR, BILIRUBINUR, KETONESUR, PROTEINUR, UROBILINOGEN, NITRITE, LEUKOCYTESUR in the last 168 hours.  Invalid input(s): APPERANCEUR  Lipid Panel:  No results found for: CHOL, TRIG, HDL, CHOLHDL, VLDL, LDLCALC  HgbA1C:  Lab Results  Component Value Date   HGBA1C 7.6* 12/25/2014    Urine Drug Screen:  No results found for: LABOPIA, COCAINSCRNUR, LABBENZ, AMPHETMU, THCU, LABBARB  Alcohol Level: No results for input(s): ETH in the last 168 hours.   Imaging: Ct Head Wo Contrast  12/24/2014  CLINICAL DATA:  Left upper extremity weakness EXAM: CT HEAD WITHOUT CONTRAST TECHNIQUE: Contiguous axial images were obtained from the base of the skull through the vertex without intravenous contrast. COMPARISON:  10/14/2014 FINDINGS: Skull and Sinuses:Negative for fracture or destructive process. The mastoids, middle ears, and imaged paranasal sinuses are clear. Orbits: No acute abnormality. Brain: No explanation for left upper extremity weakness. No evidence of acute infarct, hemorrhage, hydrocephalus, or mass lesion. Small-vessel ischemic changes in the right thalamus and bilateral basal ganglia have a stable appearance compared to prior. Ischemic gliosis is confluent throughout the bilateral cerebral white matter. Advanced generalized atrophy with prominent mesial temporal involvement correlating with history of Alzheimer's disease. IMPRESSION: 1. No acute finding. 2. Atrophy and chronic ischemic injury as described above. Electronically Signed   By: Monte Fantasia M.D.   On: 12/24/2014 16:52   [name] MV Contrast  12/25/2014  CLINICAL DATA:  Seizures. Left arm and left leg weakness for 1 week. History of Alzheimer's. EXAM: MRI HEAD WITHOUT AND WITH CONTRAST TECHNIQUE: Multiplanar, multiecho pulse sequences of the brain and surrounding structures were obtained without and with intravenous contrast. CONTRAST:  75mL  MULTIHANCE GADOBENATE DIMEGLUMINE 529 MG/ML IV SOLN COMPARISON:  Head CT 12/24/2014 FINDINGS: The study is mildly to moderately motion degraded despite utilizing faster, more motion resistant imaging protocols. There is a small acute right MCA territory cortical infarct involving the postcentral greater than precentral gyri. There is no associated hemorrhage. Gyral susceptibility artifact overlying the posterior left frontal lobe at the skull vertex is suggestive of chronic hemosiderin staining related to remote subarachnoid hemorrhage. There is advanced cerebral atrophy. Dedicated imaging through the temporal lobes is severely motion degraded, precluding detailed evaluation of the hippocampi, however there is prominent mesial temporal lobe atrophy compatible with the history of Alzheimer's disease. No abnormal enhancement is identified. Subcortical and periventricular white matter T2 hyperintensities are nonspecific but compatible with advanced chronic small vessel ischemia. Chronic ischemic changes are also noted in the deep gray nuclei bilaterally. Orbits are grossly unremarkable. No gross inflammatory changes are seen in the paranasal sinuses or mastoid air cells. Major intracranial vascular flow voids are grossly preserved, with the left vertebral artery appearing dominant. IMPRESSION: 1. Motion degraded examination. 2. Small right MCA territory perirolandic infarct. 3. Advanced cerebral atrophy and chronic small vessel ischemic disease.  Electronically Signed   By: Logan Bores M.D.   On: 12/25/2014 12:41   US Carotid Bilateral  12/25/2014  CLINICAL DATA:  74 year old male with a history of cerebral vascular accident. Cardiovascular risk factors include hyperlipidemia and diabetes. EXAM: BILATERAL CAROTID DUPLEX ULTRASOUND TECHNIQUE: Pearline Cables scale imaging, color Doppler and duplex ultrasound were performed of bilateral carotid and vertebral arteries in the neck. COMPARISON:  No prior duplex FINDINGS: Criteria:  Quantification of carotid stenosis is based on velocity parameters that correlate the residual internal carotid diameter with NASCET-based stenosis levels, using the diameter of the distal internal carotid lumen as the denominator for stenosis measurement. The following velocity measurements were obtained: RIGHT ICA:  Systolic 53 cm/sec, Diastolic 12 cm/sec CCA:  49 cm/sec SYSTOLIC ICA/CCA RATIO:  1.1 ECA:  76 cm/sec LEFT ICA:  Systolic 59 cm/sec, Diastolic 12 cm/sec CCA:  49 cm/sec SYSTOLIC ICA/CCA RATIO:  1.0 ECA:  79 cm/sec Right Brachial SBP: Not acquired Left Brachial SBP: Not acquired RIGHT CAROTID ARTERY: No significant calcifications of the right common carotid artery. Intermediate waveform maintained. Heterogeneous and partially calcified plaque at the right carotid bifurcation. No significant lumen shadowing. Low resistance waveform of the right ICA. Tortuous carotid system. RIGHT VERTEBRAL ARTERY: Antegrade flow with low resistance waveform. LEFT CAROTID ARTERY: No significant calcifications of the left common carotid artery. Intermediate waveform maintained. Heterogeneous and partially calcified plaque at the left carotid bifurcation without significant lumen shadowing. Low resistance waveform of the left ICA. Tortuous carotid system. LEFT VERTEBRAL ARTERY:  Antegrade flow with low resistance waveform. IMPRESSION: Color duplex indicates minimal heterogeneous and calcified plaque, with no hemodynamically significant stenosis by duplex criteria in the extracranial cerebrovascular circulation. Signed, Dulcy Fanny. Earleen Newport, DO [facility] Electronically Signed   By: Corrie Mckusick D.O.   On: 12/25/2014 15:37     Assessment/Plan: 74 y.o. male with a known history of seizure disorder, not on any antiepileptics, severe Alzheimer's dementia with nonverbal at baseline, newly diagnosed bladder cancer undergoing treatment brought in from neurology office for  left-sided weakness. It appears that L sided weakness is not acute and has been going on for at least one week.  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    Will likely need SNiff, due to lack of participation for rehab.  Unfortunately no family at bedside for full history Appreciate PT eval.  12/25/2014, 7:38 PM

## 2014-12-25 NOTE — Plan of Care (Addendum)
Problem: Discharge Progression Outcomes Goal: Other Discharge Outcomes/Goals Pt alert, but nonverbal and cannot follow commands at baseline still. Pt had MRI today. Right sided infarct found. Stroke education given to family. No c/o pain. VSS. Tolerating diet well. PT, OT and speech ordered to see patient. Family at the bedside. Remains on seizure precautions.

## 2014-12-25 NOTE — Progress Notes (Signed)
[place] [place] NAME: Tyler Cordova    MR#:  [id number]  DATE OF BIRTH:  [birth year]  SUBJECTIVE:  CHIEF COMPLAINT:   Chief Complaint  Patient presents with  . Weakness   Does not respond to voice. No distress  REVIEW OF SYSTEMS:   ROS unable to obtain  DRUG ALLERGIES:  No Known Allergies  VITALS:  Blood pressure 94/53, pulse 61, temperature 98.2 F (36.8 C), temperature source Oral, resp. rate 18, height 5\' 9"  (1.753 m), weight 67.631 kg (149 lb 1.6 oz), SpO2 100 %.  PHYSICAL EXAMINATION:  GENERAL:  74 y.o.-year-old patient lying in the bed with no acute distress.  EYES: holds eyes closed  HEENT: Head atraumatic, normocephalic. Oropharynx and nasopharynx clear.  NECK:  Supple, no jugular venous distention. No thyroid enlargement, no tenderness. No bruit LUNGS: Normal breath sounds bilaterally, no wheezing, rales,rhonchi or crepitation. No use of accessory muscles of respiration.  CARDIOVASCULAR: S1, S2 normal. No murmurs, rubs, or gallops.  ABDOMEN: Soft, nontender, nondistended. Bowel sounds present. No organomegaly or mass. No guarding no rebound EXTREMITIES: No pedal edema, cyanosis, or clubbing.  NEUROLOGIC: Cranial nerves II through XII are grossly intact. Does not participate in exam  PSYCHIATRIC: The patient keeps his eyes closed, does not respond to voice, withdraws from stimuli SKIN: No obvious rash, lesion, or ulcer.    LABORATORY PANEL:   CBC  Recent Labs Lab 12/25/14 0426  WBC 7.3  HGB 12.0*  HCT 35.1*  PLT 294   ------------------------------------------------------------------------------------------------------------------  Chemistries   Recent Labs Lab 12/25/14 0426  NA 140  K 3.6  CL 105  CO2 29  GLUCOSE 103*  BUN 15  CREATININE 0.83  CALCIUM 9.0   ------------------------------------------------------------------------------------------------------------------  Cardiac  Enzymes No results for input(s): TROPONINI in the last 168 hours. ------------------------------------------------------------------------------------------------------------------  RADIOLOGY:  Ct Head Wo Contrast  12/24/2014  CLINICAL DATA:  Left upper extremity weakness EXAM: CT HEAD WITHOUT CONTRAST TECHNIQUE: Contiguous axial images were obtained from the base of the skull through the vertex without intravenous contrast. COMPARISON:  10/14/2014 FINDINGS: Skull and Sinuses:Negative for fracture or destructive process. The mastoids, middle ears, and imaged paranasal sinuses are clear. Orbits: No acute abnormality. Brain: No explanation for left upper extremity weakness. No evidence of acute infarct, hemorrhage, hydrocephalus, or mass lesion. Small-vessel ischemic changes in the right thalamus and bilateral basal ganglia have a stable appearance compared to prior. Ischemic gliosis is confluent throughout the bilateral cerebral white matter. Advanced generalized atrophy with prominent mesial temporal involvement correlating with history of Alzheimer's disease. IMPRESSION: 1. No acute finding. 2. Atrophy and chronic ischemic injury as described above. Electronically Signed   By: Monte Fantasia M.D.   On: 12/24/2014 16:52   [name] WU Contrast  12/25/2014  CLINICAL DATA:  Seizures. Left arm and left leg weakness for 1 week. History of Alzheimer's. EXAM: MRI HEAD WITHOUT AND WITH CONTRAST TECHNIQUE: Multiplanar, multiecho pulse sequences of the brain and surrounding structures were obtained without and with intravenous contrast. CONTRAST:  77mL MULTIHANCE GADOBENATE DIMEGLUMINE 529 MG/ML IV SOLN COMPARISON:  Head CT 12/24/2014 FINDINGS: The study is mildly to moderately motion degraded despite utilizing faster, more motion resistant imaging protocols. There is a small acute right MCA territory cortical infarct involving the postcentral greater than precentral gyri. There is no associated hemorrhage.  Gyral susceptibility artifact overlying the posterior left frontal lobe at the skull vertex is suggestive of chronic hemosiderin staining related to remote subarachnoid  hemorrhage. There is advanced cerebral atrophy. Dedicated imaging through the temporal lobes is severely motion degraded, precluding detailed evaluation of the hippocampi, however there is prominent mesial temporal lobe atrophy compatible with the history of Alzheimer's disease. No abnormal enhancement is identified. Subcortical and periventricular white matter T2 hyperintensities are nonspecific but compatible with advanced chronic small vessel ischemia. Chronic ischemic changes are also noted in the deep gray nuclei bilaterally. Orbits are grossly unremarkable. No gross inflammatory changes are seen in the paranasal sinuses or mastoid air cells. Major intracranial vascular flow voids are grossly preserved, with the left vertebral artery appearing dominant. IMPRESSION: 1. Motion degraded examination. 2. Small right MCA territory perirolandic infarct. 3. Advanced cerebral atrophy and chronic small vessel ischemic disease. Electronically Signed   By: Logan Bores M.D.   On: 12/25/2014 12:41    EKG:   Orders placed or performed during the hospital encounter of 12/24/14  . EKG 12-Lead  . EKG 12-Lead    ASSESSMENT AND PLAN:   1) acute small right MCA infarct: - telemetry and carotid dopplers - ECHO 2 weeks ago, will not repeat - is already on full dose aspirin, could switch to plavix.   - There is some question of possible paroxysmal a-fib, will need holter monitor 30 d - PT evaluation pending. Family would like SNF placement - neurology evaluation pending  2) dementia - at baseline is non verbal and disoriented.  He does walk and feed himself, but with significant help from family.  Wife is primary care giver but has several children that help. - has not benefited from medications - follows with Dr Manuella Ghazi, neurology  3) seizure  disorder - family states that he has occasional seizures with post ictal "comatose" state that lasts for a day or so.  Not on any seizure meds - EEG pending  4) diabetes mellitus type 2: - Check A1c, start sliding scale  5) bladder cancer: He was to start intravesicular treatments this week. He will need to continue to [facility]  6) BPH: Continue Flomax  All the records are reviewed and case discussed with Care Management/Social Workerr. Management plans discussed with the patient, family and they are in agreement.  CODE STATUS: Full   TOTAL TIME TAKING CARE OF THIS PATIENT: 40 minutes.  Greater than 50% of time spent in care coordination and counseling. POSSIBLE D/C IN 2-3 DAYS, DEPENDING ON CLINICAL CONDITION.   Myrtis Ser M.D on 12/25/2014 at 2:23 PM  Between 7am to 6pm - Pager - [phone]  After 6pm go to [email or web address] - password [email or web address] [facility]  Office  [phone]  CC: Primary care physician; Lenard Simmer, MD

## 2014-12-25 NOTE — Plan of Care (Signed)
Problem: Discharge Progression Outcomes Goal: Other Discharge Outcomes/Goals Outcome: Progressing Plan of Care Progress to Goal:   Pt has been resting comfortably during shift. Pt was not impulsive during shift. Pt is nonverbal and does not speak [place], only [place]. Pt can be bradycardic. No other signs of distress noted. Will continue to monitor.

## 2014-12-25 NOTE — Evaluation (Signed)
Clinical/Bedside Swallow Evaluation Patient Details  Name: Tyler Cordova MRN: [id number] Date of Birth: 26-Jan-1941  Today's Date: 12/25/2014 Time: SLP Start Time (ACUTE ONLY): 1000 SLP Stop Time (ACUTE ONLY): 1059 SLP Time Calculation (min) (ACUTE ONLY): 59 min  Past Medical History:  Past Medical History  Diagnosis Date  . Seizures ([place])     Per family, patient has epileptic seizures; but he does not take medicines for it;   . Alzheimer's dementia     Rapidly progressive Alzheimer's Dementia. Still at home but now with increasing behavioral problems. Uses Diapers for Bowel and Urinary leakage. Wife normally takes care of him at home but she and most of his family are in [place] for an extended period of time so he is staying at [facility] assisted living for time being  . Diabetes mellitus without complication ([place])     Dx 10/27/2012, followed with Delrae Rend MD   . Bladder cancer [facility])     Multifocal Low-Grade Transitional Cell Carcinoma; followed by Ivin Booty, MD([facility])  . Hyperlipidemia     On statin; dx 10/27/2012, followed with Delrae Rend MD    . 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 Dementia    Past Surgical History:  Past Surgical History  Procedure Laterality Date  . Ureteroscopies      for bladder cancer   HPI:  Tyler Cordova is a 74 y.o. male with a known history of seizure disorder, not on any antiepileptics, severe Alzheimer's dementia with nonverbal at baseline, newly diagnosed bladder cancer undergoing treatment brought in from neurology office for left-sided weakness.   Assessment / Plan / Recommendation Clinical Impression  Pt presented with no overt or immediate s/s of aspiration on trials of ice chips (3 single), thin liquids (10-12 ounces), purees (~5 ounces total), and solids (16 trials of graham cracker bites).  Pt cues ST for next bite by opening mouth and making eye contact.  Required  feeding/total assist. Slight munching pattern noted when consuming graham cracker pieces, however this pattern became more rotary as he continued to chew and form the bolus. Pt used lingual sweep to clear mouth and adaquately cleared -- ST periodically presented liquids as washdown to ensure all residue cleared.   Pt would benefit from a mechancal soft diet with thin liquids (by straw). Pt's son reports he is a good eater and does best feeding himself when food is presented in a bowl and he is offered a spoon.  During this visit, Pt required total assist in feeding. Meds whole in puree at this time. Pt to use aspiration precautions. NSG updated -- requested NSG contact ST if any change of status swallowing; Pt appears to be at his baseline (per son).   Aspiration Risk   (reduced)    Diet Recommendation Dysphagia 3 (Mech soft);Thin   Medication Administration: Whole meds with puree Compensations: Minimize environmental distractions;Slow rate;Small sips/bites;Check for pocketing;Follow solids with liquid    Other  Recommendations Oral Care Recommendations: Oral care QID;Staff/trained caregiver to provide oral care   Follow Up Recommendations    TBD   Frequency and Duration        Pertinent Vitals/Pain None reported    SLP Swallow Goals  N/A   Swallow Study Prior Functional Status    Regular diet with thin liquids.    General Date of Onset: 12/24/14 Other Pertinent Information: Tyler Cordova is a 74 y.o. male with a known history of seizure  disorder, not on any antiepileptics, severe Alzheimer's dementia with nonverbal at baseline, newly diagnosed bladder cancer undergoing treatment brought in from neurology office for left-sided weakness. Type of Study: Bedside swallow evaluation Diet Prior to this Study: Regular;Thin liquids Temperature Spikes Noted: No Respiratory Status: Room air History of Recent Intubation: No Behavior/Cognition: Alert;Cooperative;Pleasant mood Oral Cavity  - Dentition: Adequate natural dentition/normal for age Self-Feeding Abilities: Total assist;Needs set up Patient Positioning: Upright in bed Baseline Vocal Quality: Not observed    Oral/Motor/Sensory Function Overall Oral Motor/Sensory Function: Appears within functional limits for tasks assessed (as observed during bolus managment) Labial Symmetry: Within Functional Limits Lingual Symmetry: Within Functional Limits Facial Symmetry: Within Functional Limits Mandible: Within Functional Limits (as observed during bolus managment)   Ice Chips Ice chips: Within functional limits Presentation: Spoon Other Comments: Pt consumed 3 single ice chips without any overt or immediate s/s of aspiration.   Thin Liquid Thin Liquid: Within functional limits Presentation: Straw Other Comments: Pt consumed 10-12 ounces of thin liquid without any overt or immediate s/s of aspiration    Nectar Thick Nectar Thick Liquid: Not tested   Honey Thick Honey Thick Liquid: Not tested   Puree Puree: Within functional limits Presentation: Spoon Other Comments: Pt consumed ~1 ounce of apple sauce and 4 ounces of ice cream without any overt or immediate s/s of aspiration   Solid   GO    Solid: Within functional limits Presentation:  (ST fed Pt small graham cracker bites) Other Comments: Pt consumed 16 bites of graham cracker without any overt or immediate s/s of aspiration. Munching pattern observed initially but became rotary chewing -- Pt adaquately cleared mouth of bolus and used lingual sweep.       Tyler Cordova 12/25/2014,10:59 AM

## 2014-12-25 NOTE — Evaluation (Signed)
Physical Therapy Evaluation Patient Details Name: Tyler Cordova MRN: [id number] DOB: 01-May-1940 Today's Date: 12/25/2014   History of Present Illness  74 y.o. male with a known history of seizure disorder, not on any antiepileptics, severe Alzheimer's dementia with nonverbal at baseline, newly diagnosed bladder cancer undergoing treatment brought in from neurology office for left-sided weakness. Patient is unable to provide any history, his son at bedside and provides most of the history. And history is also obtained from Dr. Guinevere Scarlet notes. Patient was seen by neurologist in the office about 2 months ago for his ongoing seizures, at that time decision was made not to start any antiepileptics on him. According to son patient seizures are followed by a period of decreased responsiveness which recovers after 24-48 hours. Last week, patient woke up and was noticed to be weaker on his left side as he was dragging his left side to walk. Normally his gait is unsteady, but he cruises around the house without any difficulty. He was also noticed to be weaker on his left arm. 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    Clinical Impression  Pt is unresponsive to all verbal commands/questions due to hx of dementia and Alzheimers  and only speaks/ understands [place]. Pt's son is present whom pt seems to respond well to. Pt is total assist with all transfers and needs significant tactile cues in order to understand the task that you are asking him to perform. According to pt's son, the pt has regained some strength in his LLE in the past week; has gone from not being able to walk to walking with HHA. Pt's LUE is almost completely flaccid with trace active movement noted at shoulder. Pt typically lives at home with his wife, she is able to take care of all ADLs for him but due to recent decline in functional status she is no longer able to do so safely. Pt's children have been taking caring for him  each day but according to pt's son this is not a sustainable long-term solution. Pt's son is asking for pt to be placed at inpatient rehab or another form of structured rehabilitation program. Due to pt's language barriers and inability to verbally communicate due to dementia and alzheimers, ability to participate in a structured rehab program has to be questioned. Pt will be taken on as a trial acute PT patient and d/c recommendation will be determined (SNF vs LTACH) based on ability to participate and show improvement with acute rehab.     Follow Up Recommendations SNF;LTACH (Short term rehab vs LTACH pending ability to participate and show improvement with acute PT. )    Equipment Recommendations  None recommended by PT    Recommendations for Other Services       Precautions / Restrictions Precautions Precautions: Fall Restrictions Weight Bearing Restrictions: No      Mobility  Bed Mobility Overal bed mobility: Needs Assistance Bed Mobility: Supine to Sit     Supine to sit: Max assist;Total assist     General bed mobility comments: pt dependent for supine<>sit transfer  Transfers Overall transfer level: Needs assistance   Transfers: Sit to/from Stand Sit to Stand: Total assist         General transfer comment: pt total assist for sit<>stand transfer; pt resists standing with people that are unfamiliar to him.   Ambulation/Gait Ambulation/Gait assistance: Mod assist Ambulation Distance (Feet): 20 Feet Assistive device: 2 person hand held assist Gait Pattern/deviations: Step-to pattern;Decreased step  length - left;Decreased step length - right;Shuffle Gait velocity: decreased Gait velocity interpretation: <1.8 ft/sec, indicative of risk for recurrent falls General Gait Details: pt needs to be led in specific direction; does not voltionally ambulate to a self -determined target/destination. Pt able to maintain BW and no instances of knee buckling noted.   Stairs             Wheelchair Mobility    Modified Rankin (Stroke Patients Only)       Balance Overall balance assessment: Needs assistance Sitting-balance support: No upper extremity supported;Feet supported Sitting balance-Leahy Scale: Fair Sitting balance - Comments: able to maintain seated on EOB without assistance from BUEs    Standing balance support: Bilateral upper extremity supported;During functional activity Standing balance-Leahy Scale: Poor Standing balance comment: pt is a serious fall risk and needs to have close HHA at all times when ambulating to prevent serious injury.                              Pertinent Vitals/Pain Pain Assessment:  (pt unable to state whether or not he is experiencing pain)    Home Living Family/patient expects to be discharged to:: Private residence Living Arrangements: Spouse/significant other Available Help at Discharge: Family (children: 2 sons and a daughter. Pt lives with his wife. She takes care of him normally but has been unable to do so after this recent onset of L sided weakness. His 2 sons and daughter have been stopping in every day for the last 2 weeks to assist. )                  Prior Function Level of Independence: Needs assistance   Gait / Transfers Assistance Needed: pt relies on his wife/ children for assistance with mobility  ADL's / Homemaking Assistance Needed: pt's wife takes care of ADLs for him; he is completely dependent        Hand Dominance   Dominant Hand: Right    Extremity/Trunk Assessment   Upper Extremity Assessment: LUE deficits/detail       LUE Deficits / Details: LUE is mostly flaccid. Minimal active motion noted at the shoulder.    Lower Extremity Assessment: LLE deficits/detail   LLE Deficits / Details: LLE weakness noted comared to R; pt is able to support BW on LLE despite weakness. Formal MMT could not be performed due to language and command following limitations.  Strength grossly 3/5 assessed via functional movement.      Communication   Communication:  (pt understands [place] only. Does not speak at all. Does not respond to verbal commands/ questions due to baseline dementia/alzheimers)  Cognition Arousal/Alertness: Awake/alert Behavior During Therapy:  (confused/ needed to be guided by son ) Overall Cognitive Status: History of cognitive impairments - at baseline                      General Comments      Exercises        Assessment/Plan    PT Assessment Patient needs continued PT services  PT Diagnosis Difficulty walking;Abnormality of gait;Hemiplegia non-dominant side;Altered mental status   PT Problem List Decreased strength;Decreased range of motion;Decreased activity tolerance;Decreased balance;Decreased mobility;Decreased coordination;Decreased cognition;Decreased safety awareness;Impaired tone  PT Treatment Interventions Gait training;Functional mobility training;Therapeutic activities;Therapeutic exercise   PT Goals (Current goals can be found in the Care Plan section) Acute Rehab PT Goals PT Goal Formulation: Patient unable to participate in goal  setting    Frequency 7X/week   Barriers to discharge Decreased caregiver support pt's son stated that he and his siblings have been helping out everyday with the care of their father. due to his recent decline in [place] the wife of the pt is no longer able to safely assist with mobility and ADLs as she had done in the past. Pt's son states that this is not a permanent solution and they would need some help eventually with caring for the pt.     Co-evaluation               End of Session Equipment Utilized During Treatment: Gait belt Activity Tolerance: Patient tolerated treatment well Patient left: in bed;with family/visitor present;with bed alarm set Nurse Communication: Mobility status         Time: 7408-1448 PT Time Calculation (min) (ACUTE ONLY): 20  min   Charges:         PT G CodesMilon Score 12/25/2014, 4:23 PM

## 2014-12-25 NOTE — Progress Notes (Deleted)
PT Cancellation Note  Patient Details Name: Tyler Cordova MRN: [id number] DOB: 10-Sep-1940   Cancelled Treatment:    Reason Eval/Treat Not Completed: Patient at procedure or test/unavailable (will re-attempt at evaluation at later time/date. )   Milon Score 12/25/2014, 2:57 PM

## 2014-12-26 ENCOUNTER — Encounter
Admission: RE | Admit: 2014-12-26 | Discharge: 2014-12-26 | Disposition: A | Discharge status: Home / Self Care | Payer: Medicare PPO | Source: Ambulatory Visit | Attending: Internal Medicine | Admitting: Internal Medicine

## 2014-12-26 DIAGNOSIS — E119 Type 2 diabetes mellitus without complications: Secondary | ICD-10-CM | POA: Insufficient documentation

## 2014-12-26 DIAGNOSIS — Z794 Long term (current) use of insulin: Secondary | ICD-10-CM | POA: Insufficient documentation

## 2014-12-26 DIAGNOSIS — I63311 Cerebral infarction due to thrombosis of right middle cerebral artery: Secondary | ICD-10-CM

## 2014-12-26 LAB — CBC
HEMATOCRIT: 35 % — AB (ref 40.0–52.0)
Hemoglobin: 12.1 g/dL — ABNORMAL LOW (ref 13.0–18.0)
MCH: 30 pg (ref 26.0–34.0)
MCHC: 34.7 g/dL (ref 32.0–36.0)
MCV: 86.4 fL (ref 80.0–100.0)
PLATELETS: 264 10*3/uL (ref 150–440)
RBC: 4.05 MIL/uL — ABNORMAL LOW (ref 4.40–5.90)
RDW: 14.4 % (ref 11.5–14.5)
WBC: 7.1 10*3/uL (ref 3.8–10.6)

## 2014-12-26 LAB — BASIC METABOLIC PANEL
Anion gap: 8 (ref 5–15)
BUN: 16 mg/dL (ref 6–20)
CALCIUM: 8.8 mg/dL — AB (ref 8.9–10.3)
CO2: 27 mmol/L (ref 22–32)
CREATININE: 0.85 mg/dL (ref 0.61–1.24)
Chloride: 104 mmol/L (ref 101–111)
GLUCOSE: 109 mg/dL — AB (ref 65–99)
Potassium: 3.8 mmol/L (ref 3.5–5.1)
Sodium: 139 mmol/L (ref 135–145)

## 2014-12-26 LAB — URINALYSIS COMPLETE WITH MICROSCOPIC (ARMC ONLY)
Bilirubin Urine: NEGATIVE
GLUCOSE, UA: NEGATIVE mg/dL
Ketones, ur: NEGATIVE mg/dL
NITRITE: NEGATIVE
PROTEIN: NEGATIVE mg/dL
SPECIFIC GRAVITY, URINE: 1.01 (ref 1.005–1.030)
SQUAMOUS EPITHELIAL / LPF: NONE SEEN
pH: 5 (ref 5.0–8.0)

## 2014-12-26 LAB — GLUCOSE, CAPILLARY
Glucose-Capillary: 154 mg/dL — ABNORMAL HIGH (ref 65–99)
Glucose-Capillary: 127 mg/dL — ABNORMAL HIGH (ref 65–99)

## 2014-12-26 MED ORDER — DEXTROSE 5 % IV SOLN
1.0000 g | INTRAVENOUS | Status: DC
  Administered 2014-12-26 – 2014-12-27 (×2): 1 g via INTRAVENOUS
  Filled 2014-12-26 (×3): qty 10

## 2014-12-26 NOTE — Progress Notes (Signed)
Physical Therapy Treatment Patient Details Name: Tyler Cordova MRN: [id number] DOB: [birth year] Today's Date: 12/26/2014    History of Present Illness 74 y.o. male with a known history of seizure disorder, not on any antiepileptics, severe Alzheimer's dementia with nonverbal at baseline, newly diagnosed bladder cancer undergoing treatment brought in from neurology office for left-sided weakness. Patient is unable to provide any history, his son at bedside and provides most of the history. And history is also obtained from Dr. Guinevere Cordova notes. Patient was seen by neurologist in the office about 2 months ago for his ongoing seizures, at that time decision was made not to start any antiepileptics on him. According to son patient seizures are followed by a period of decreased responsiveness which recovers after 24-48 hours. Last week, patient woke up and was noticed to be weaker on his left side as he was dragging his left side to walk. Normally his gait is unsteady, but he cruises around the house without any difficulty. He was also noticed to be weaker on his left arm. 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      PT Comments    Pt able to participate in therapy today and follow tactile cues/ initiation of movements with the help of the pt's son. Pt is total assist for supine<>sitting on EOB transfer; resisted sitting up at first. Goal of therapy session today was to work on functional LE strength and endurance by performing multiple sit<>stand transfers followed by bouts of walking to pt's tolerance. Pt ambulated 71 ft x 4 with 1 person HHA with his son guiding him in desired direction. Pt poses fall risk due to baseline level of dementia and Alzheimers and L sided leg weakness. With sit<>stand transfer, pt varies between min - max assist depending on his understanding and motivation to complete task. Pt will continue to benefit from skilled acute PT services in order to progress  functional mobility.   Follow Up Recommendations  SNF     Equipment Recommendations  None recommended by PT    Recommendations for Other Services       Precautions / Restrictions Precautions Precautions: Fall Restrictions Weight Bearing Restrictions: No    Mobility  Bed Mobility Overal bed mobility: Needs Assistance Bed Mobility: Supine to Sit     Supine to sit: Total assist     General bed mobility comments: pt dependent for supine<>sit transfer  Transfers Overall transfer level: Needs assistance   Transfers: Sit to/from Stand Sit to Stand: Min assist;Max assist         General transfer comment: PT displays transfers where he requires max assist to stand and other times where he requiers min assist to stand. Varies based on his level of cooperation/understanding of task required of him.   Ambulation/Gait Ambulation/Gait assistance: Mod assist Ambulation Distance (Feet):  (greater than 75 ft x 4) Assistive device: None;1 person hand held assist Gait Pattern/deviations: Shuffle;Decreased step length - right;Decreased step length - left;Step-to pattern Gait velocity: decreased Gait velocity interpretation: <1.8 ft/sec, indicative of risk for recurrent falls General Gait Details: pt requires mod assist due to safety concerns with falls and becoming confused when walking. He is able to stand and take steps under his own power without assistive device but requries someone leading him in correct direction.    Stairs            Wheelchair Mobility    Modified Rankin (Stroke Patients Only)       Balance  Overall balance assessment: Needs assistance Sitting-balance support: No upper extremity supported;Feet supported   Sitting balance - Comments: able to maintain seated on EOB without assistance from BUEs    Standing balance support: Bilateral upper extremity supported;During functional activity Standing balance-Leahy Scale: Fair                       Cognition Arousal/Alertness: Awake/alert Behavior During Therapy: WFL for tasks assessed/performed Overall Cognitive Status: History of cognitive impairments - at baseline                      Exercises Other Exercises Other Exercises: sit<>stand x 5 w/ HHA and min-max assist (pt's level of understanding what is being asked of him varies the level of assistance required to perform transfer)     General Comments        Pertinent Vitals/Pain Pain Assessment:  (pt does not make pain verbally known)    Home Living Family/patient expects to be discharged to:: Skilled nursing facility Living Arrangements: Spouse/significant other Available Help at Discharge:  (normally wife cares for patient)                Prior Function Level of Independence: Needs assistance  Gait / Transfers Assistance Needed: Patient was able to walk at home with hand held assist to toilet.  ADL's / Homemaking Assistance Needed: Wife cars for him, He did self feed.     PT Goals (current goals can now be found in the care plan section) Acute Rehab PT Goals Patient Stated Goal: unable to state, son would like skilled nursing. PT Goal Formulation: Patient unable to participate in goal setting Progress towards PT goals: Progressing toward goals    Frequency  7X/week    PT Plan      Co-evaluation             End of Session Equipment Utilized During Treatment: Gait belt Activity Tolerance: Patient tolerated treatment well Patient left: in chair;with family/visitor present;with call bell/phone within reach;with chair alarm set     Time: 1345-1410 PT Time Calculation (min) (ACUTE ONLY): 25 min  Charges:                       G CodesMilon Score 12/26/2014, 2:59 PM

## 2014-12-26 NOTE — Progress Notes (Signed)
Speech Language Pathology Treatment: Dysphagia  Patient Details Name: Tyler Cordova MRN: [id number] DOB: March 01, 1941 Today's Date: 12/26/2014 Time: 8338-2505 SLP Time Calculation (min) (ACUTE ONLY): 40 min  Assessment / Plan / Recommendation Clinical Impression  Pt appears to be at reduced risk for aspiration at this time when being assisted w/ his meals and following general aspiration precautions. Pt appears to be tolerating a general mech soft diet w/ thin liquids being assisted at meals by staff. Pt is hampered overall by his severe dementia but appears to tolerate an oral diet adequately. Rec. Continue w/ current diet as ordered w/ aspiration precautions and assistance at meals; meds in puree. No further skilled ST services indicated. NSG to reconsult ST if any decline/change in status. Suspect pt is close to/at his baseline re: his oral intake/diet. NSG updated and agreed.    HPI Other Pertinent Information: Pt is a 74 y.o. male with a known history of seizure disorder, not on any antiepileptics, severe Alzheimer's dementia with nonverbal status primarily at baseline per family, and newly diagnosed bladder cancer undergoing treatment who was brought in from neurology office for concern for left-sided weakness. Pt has been eating his meals; assisted w/ feeding by NSG w/ no reported s/s of aspiration by staff; pt is an enthusiastic eater per report.    Pertinent Vitals Pain Assessment: No/denies pain (no grimacing)  SLP Plan  Discharge SLP treatment due to (comment);All goals met    Recommendations Diet recommendations: Dysphagia 3 (mechanical soft);Thin liquid Liquids provided via: Cup;Straw Medication Administration: Whole meds with puree Supervision: Full supervision/cueing for compensatory strategies;Staff to assist with self feeding Compensations: Minimize environmental distractions;Slow rate;Small sips/bites;Check for pocketing;Follow solids with liquid Postural Changes and/or  Swallow Maneuvers: Seated upright 90 degrees              General recommendations:  (n/a) Oral Care Recommendations: Oral care BID;Staff/trained caregiver to provide oral care Follow up Recommendations:  (TBD) Plan: Discharge SLP treatment due to (comment);All goals met    [place], MS, CCC-SLP  Cordova,Tyler 12/26/2014, 5:25 PM

## 2014-12-26 NOTE — Plan of Care (Signed)
Problem: Discharge Progression Outcomes Goal: Other Discharge Outcomes/Goals Outcome: Progressing Pt at baseline, remains unresponsive but he does respond to son at times. No pain noted. Pt worked with PT today and tolerated well. Awaiting bed placement.

## 2014-12-26 NOTE — Consult Note (Signed)
CC: L sided weakness  HPI: Tyler Cordova is an 74 y.o. male with a known history of seizure disorder, not on any antiepileptics, severe Alzheimer's dementia with nonverbal at baseline, newly diagnosed bladder cancer undergoing treatment brought in from neurology office for left-sided weakness. It appears that L sided weakness is not acute and has been going on for at least one week.     Past Medical History  Diagnosis Date  . Seizures ([place])     Per family, patient has epileptic seizures; but he does not take medicines for it;   . Alzheimer's dementia     Rapidly progressive Alzheimer's Dementia. Still at home but now with increasing behavioral problems. Uses Diapers for Bowel and Urinary leakage. Wife normally takes care of him at home but she and most of his family are in [place] for an extended period of time so he is staying at [facility] assisted living for time being  . Diabetes mellitus without complication ([place])     Dx 10/27/2012, followed with Delrae Rend MD   . Bladder cancer [facility])     Multifocal Low-Grade Transitional Cell Carcinoma; followed by Ivin Booty, MD([facility])  . Hyperlipidemia     On statin; dx 10/27/2012, followed with Delrae Rend MD    . 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 Dementia     Past Surgical History  Procedure Laterality Date  . Ureteroscopies      for bladder cancer    Family History  Problem Relation Age of Onset  . Cirrhosis Father     Father was an alcoholic    Social History:  reports that he has never smoked. He does not have any smokeless tobacco history on file. He reports that he does not drink alcohol or use illicit drugs.  No Known Allergies  Medications: I have reviewed the patient's current medications.  ROS: Unable to obtain due to poor baseline and dementia   Physical Examination: Blood pressure 108/52, pulse 58, temperature 98.7 F (37.1 C), temperature source  Oral, resp. rate 19, height 5\' 9"  (1.753 m), weight 149 lb 1.6 oz (67.631 kg), SpO2 100 %.    Neurological Examination Mental Status: Non verbal does not follow commands.  Cranial Nerves: II: Discs flat bilaterally; Visual fields grossly normal, pupils equal, round, reactive to light and accommodation III,IV, VI: ptosis not present, extra-ocular motions intact bilaterally V,VII: smile symmetric, facial light touch sensation normal bilaterally VIII: hearing normal bilaterally IX,X: gag reflex present XI: bilateral shoulder shrug XII: midline tongue extension Motor: Right : Upper extremity   4/5    Left:     Upper extremity   2/5 proximally not moving distal part  Lower extremity   4/5     Lower extremity   3+/5 Tone and bulk:normal tone throughout; no atrophy noted Sensory: Pinprick and light touch intact throughout, bilaterally Deep Tendon Reflexes: 2+ and symmetric throughout Cerebellar: Not tested  Gait: not tested.       Laboratory Studies:   Basic Metabolic Panel:  Recent Labs Lab 12/24/14 1521 12/25/14 0426 12/26/14 0449  NA 137 140 139  K 4.1 3.6 3.8  CL 104 105 104  CO2 24 29 27   GLUCOSE 135* 103* 109*  BUN 16 15 16   CREATININE 1.04 0.83 0.85  CALCIUM 8.7* 9.0 8.8*    Liver Function Tests: No results for input(s): AST, ALT, ALKPHOS, BILITOT, PROT, ALBUMIN in the last 168 hours. No results  for input(s): LIPASE, AMYLASE in the last 168 hours. No results for input(s): AMMONIA in the last 168 hours.  CBC:  Recent Labs Lab 12/24/14 1521 12/25/14 0426 12/26/14 0449  WBC 7.4 7.3 7.1  HGB 12.7* 12.0* 12.1*  HCT 38.5* 35.1* 35.0*  MCV 87.7 87.3 86.4  PLT 320 294 264    Cardiac Enzymes: No results for input(s): CKTOTAL, CKMB, CKMBINDEX, TROPONINI in the last 168 hours.  BNP: Invalid input(s): POCBNP  CBG:  Recent Labs Lab 12/24/14 2059 12/25/14 0729 12/25/14 1633 12/25/14 2112 12/26/14 0941  GLUCAP 75 92 142* 103* 154*     Microbiology: No results found for this or any previous visit.  Coagulation Studies: No results for input(s): LABPROT, INR in the last 72 hours.  Urinalysis:   Recent Labs Lab 12/26/14 0414  COLORURINE YELLOW*  LABSPEC 1.010  PHURINE 5.0  GLUCOSEU NEGATIVE  HGBUR 1+*  BILIRUBINUR NEGATIVE  KETONESUR NEGATIVE  PROTEINUR NEGATIVE  NITRITE NEGATIVE  LEUKOCYTESUR 3+*    Lipid Panel:  No results found for: CHOL, TRIG, HDL, CHOLHDL, VLDL, LDLCALC  HgbA1C:  Lab Results  Component Value Date   HGBA1C 7.6* 12/25/2014    Urine Drug Screen:  No results found for: LABOPIA, COCAINSCRNUR, LABBENZ, AMPHETMU, THCU, LABBARB  Alcohol Level: No results for input(s): ETH in the last 168 hours.   Imaging: Ct Head Wo Contrast  12/24/2014  CLINICAL DATA:  Left upper extremity weakness EXAM: CT HEAD WITHOUT CONTRAST TECHNIQUE: Contiguous axial images were obtained from the base of the skull through the vertex without intravenous contrast. COMPARISON:  10/14/2014 FINDINGS: Skull and Sinuses:Negative for fracture or destructive process. The mastoids, middle ears, and imaged paranasal sinuses are clear. Orbits: No acute abnormality. Brain: No explanation for left upper extremity weakness. No evidence of acute infarct, hemorrhage, hydrocephalus, or mass lesion. Small-vessel ischemic changes in the right thalamus and bilateral basal ganglia have a stable appearance compared to prior. Ischemic gliosis is confluent throughout the bilateral cerebral white matter. Advanced generalized atrophy with prominent mesial temporal involvement correlating with history of Alzheimer's disease. IMPRESSION: 1. No acute finding. 2. Atrophy and chronic ischemic injury as described above. Electronically Signed   By: Monte Fantasia M.D.   On: 12/24/2014 16:52   [name] HR Contrast  12/25/2014  CLINICAL DATA:  Seizures. Left arm and left leg weakness for 1 week. History of Alzheimer's. EXAM: MRI HEAD WITHOUT AND  WITH CONTRAST TECHNIQUE: Multiplanar, multiecho pulse sequences of the brain and surrounding structures were obtained without and with intravenous contrast. CONTRAST:  33mL MULTIHANCE GADOBENATE DIMEGLUMINE 529 MG/ML IV SOLN COMPARISON:  Head CT 12/24/2014 FINDINGS: The study is mildly to moderately motion degraded despite utilizing faster, more motion resistant imaging protocols. There is a small acute right MCA territory cortical infarct involving the postcentral greater than precentral gyri. There is no associated hemorrhage. Gyral susceptibility artifact overlying the posterior left frontal lobe at the skull vertex is suggestive of chronic hemosiderin staining related to remote subarachnoid hemorrhage. There is advanced cerebral atrophy. Dedicated imaging through the temporal lobes is severely motion degraded, precluding detailed evaluation of the hippocampi, however there is prominent mesial temporal lobe atrophy compatible with the history of Alzheimer's disease. No abnormal enhancement is identified. Subcortical and periventricular white matter T2 hyperintensities are nonspecific but compatible with advanced chronic small vessel ischemia. Chronic ischemic changes are also noted in the deep gray nuclei bilaterally. Orbits are grossly unremarkable. No gross inflammatory changes are seen in the paranasal sinuses or mastoid air  cells. Major intracranial vascular flow voids are grossly preserved, with the left vertebral artery appearing dominant. IMPRESSION: 1. Motion degraded examination. 2. Small right MCA territory perirolandic infarct. 3. Advanced cerebral atrophy and chronic small vessel ischemic disease. Electronically Signed   By: Logan Bores M.D.   On: 12/25/2014 12:41   US Carotid Bilateral  12/25/2014  CLINICAL DATA:  74 year old male with a history of cerebral vascular accident. Cardiovascular risk factors include hyperlipidemia and diabetes. EXAM: BILATERAL CAROTID DUPLEX ULTRASOUND TECHNIQUE:  Pearline Cables scale imaging, color Doppler and duplex ultrasound were performed of bilateral carotid and vertebral arteries in the neck. COMPARISON:  No prior duplex FINDINGS: Criteria: Quantification of carotid stenosis is based on velocity parameters that correlate the residual internal carotid diameter with NASCET-based stenosis levels, using the diameter of the distal internal carotid lumen as the denominator for stenosis measurement. The following velocity measurements were obtained: RIGHT ICA:  Systolic 53 cm/sec, Diastolic 12 cm/sec CCA:  49 cm/sec SYSTOLIC ICA/CCA RATIO:  1.1 ECA:  76 cm/sec LEFT ICA:  Systolic 59 cm/sec, Diastolic 12 cm/sec CCA:  49 cm/sec SYSTOLIC ICA/CCA RATIO:  1.0 ECA:  79 cm/sec Right Brachial SBP: Not acquired Left Brachial SBP: Not acquired RIGHT CAROTID ARTERY: No significant calcifications of the right common carotid artery. Intermediate waveform maintained. Heterogeneous and partially calcified plaque at the right carotid bifurcation. No significant lumen shadowing. Low resistance waveform of the right ICA. Tortuous carotid system. RIGHT VERTEBRAL ARTERY: Antegrade flow with low resistance waveform. LEFT CAROTID ARTERY: No significant calcifications of the left common carotid artery. Intermediate waveform maintained. Heterogeneous and partially calcified plaque at the left carotid bifurcation without significant lumen shadowing. Low resistance waveform of the left ICA. Tortuous carotid system. LEFT VERTEBRAL ARTERY:  Antegrade flow with low resistance waveform. IMPRESSION: Color duplex indicates minimal heterogeneous and calcified plaque, with no hemodynamically significant stenosis by duplex criteria in the extracranial cerebrovascular circulation. Signed, Dulcy Fanny. Earleen Newport, DO [facility] Electronically Signed   By: Corrie Mckusick D.O.   On: 12/25/2014 15:37     Assessment/Plan: 74 y.o. male with a known history of seizure  disorder, not on any antiepileptics, severe Alzheimer's dementia with nonverbal at baseline, newly diagnosed bladder cancer undergoing treatment brought in from neurology office for left-sided weakness. It appears that L sided weakness is not acute and has been going on for at least one week.  RMCA stroke Was able to ambulate today As per family symptoms improved. No further imaging from neuro stand point.  Leotis Pain  12/26/2014, 2:45 PM

## 2014-12-26 NOTE — Clinical Social Work Placement (Signed)
   CLINICAL SOCIAL WORK PLACEMENT  NOTE  Date:  12/26/2014  Patient Details  Name: Tyler Cordova MRN: [id number] Date of Birth: October 20, 1940  Clinical Social Work is seeking post-discharge placement for this patient at the [place] level of care (*CSW will initial, date and re-position this form in  chart as items are completed):  Yes   Patient/family provided with [facility] Work Department's list of facilities offering this level of care within the geographic area requested by the patient (or if unable, by the patient's family).  Yes   Patient/family informed of their freedom to choose among providers that offer the needed level of care, that participate in Medicare, Medicaid or managed care program needed by the patient, have an available bed and are willing to accept the patient.  Yes   Patient/family informed of [facility]'s ownership interest in [facility] and [facility], as well as of the fact that they are under no obligation to receive care at these facilities.  PASRR submitted to EDS on       PASRR number received on       Existing PASRR number confirmed on       FL2 transmitted to all facilities in geographic area requested by pt/family on 12/26/14     FL2 transmitted to all facilities within larger geographic area on       Patient informed that his/her managed care company has contracts with or will negotiate with certain facilities, including the following:        Yes   Patient/family informed of bed offers received.  Patient chooses bed at [facility]     Physician recommends and patient chooses bed at      Patient to be transferred to [facility] on  .  Patient to be transferred to facility by       Patient family notified on   of transfer.  Name of family member notified:        PHYSICIAN       Additional Comment:    _______________________________________________ Alonna Buckler, LCSW 12/26/2014,  9:57 PM

## 2014-12-26 NOTE — NC FL2 (Addendum)
[place] LEVEL OF CARE SCREENING TOOL     IDENTIFICATION  Patient Name: Tyler Cordova Birthdate: 09-13-1940 Sex: male Admission Date (Current Location): 12/24/2014  [place] and [place] Number: Educational psychologist and Address:  [facility], [address] [place], [place] [address]      Provider Number: [id number]  Attending Physician Name and Address:  Aldean Jewett, MD  Relative Name and Phone Number:       Current Level of Care: SNF Recommended Level of Care: [place] Prior Approval Number:    Date Approved/Denied:   PASRR Number:    Discharge Plan: SNF    Current Diagnoses: Patient Active Problem List   Diagnosis Date Noted  . 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 Seizures ([place])   . Alzheimer's dementia   . Diabetes mellitus without complication ([place])   . Bladder cancer ([place])   . Hyperlipidemia    Past Medical History  Diagnosis Date  . Seizures ([place])     Per family, patient has epileptic seizures; but he does not take medicines for it;   . Alzheimer's dementia     Rapidly progressive Alzheimer's Dementia. Still at home but now with increasing behavioral problems. Uses Diapers for Bowel and Urinary leakage. Wife normally takes care of him at home but she and most of his family are in [place] for an extended period of time so he is staying at [facility] assisted living for time being  . Diabetes mellitus without complication ([place])     Dx 10/27/2012, followed with Delrae Rend MD   . Bladder cancer [facility])     Multifocal Low-Grade Transitional Cell Carcinoma; followed by Ivin Booty, MD([facility])  . Hyperlipidemia     On statin; dx 10/27/2012, followed with Delrae Rend MD   . 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 Dementia          Orientation ACTIVITIES/SOCIAL BLADDER RESPIRATION    Self, Place  Family  supportive Incontinent Normal  BEHAVIORAL SYMPTOMS/MOOD NEUROLOGICAL BOWEL NUTRITION STATUS    Convulsions/Seizures Incontinent    PHYSICIAN VISITS COMMUNICATION OF NEEDS Height & Weight Skin    Non-verbal    149lbs Normal          AMBULATORY STATUS RESPIRATION      Normal      Personal Care Assistance Level of Assistance  Total care       Total Care Assistance: Maximum assistance    Functional Limitations Info                SPECIAL CARE FACTORS FREQUENCY  PT (By licensed PT), OT (By licensed OT)              5x week     Additional Factors Info                  Current Medications (12/26/2014): Current Facility-Administered Medications  Medication Dose Route Frequency Provider Last Rate Last Dose  . acetaminophen (TYLENOL) tablet 650 mg  650 mg Oral Q6H PRN Gladstone Lighter, MD       Or  . acetaminophen (TYLENOL) suppository 650 mg  650 mg Rectal Q6H PRN Gladstone Lighter, MD      . aspirin tablet 325 mg  325 mg Oral QHS Gladstone Lighter, MD   325 mg at 12/25/14 2145  . cefTRIAXone (ROCEPHIN) 1 g in dextrose 5 % 50 mL IVPB  1  g Intravenous Q24H Aldean Jewett, MD      . enoxaparin (LOVENOX) injection 40 mg  40 mg Subcutaneous Q24H Gladstone Lighter, MD   40 mg at 12/25/14 2148  . HYDROcodone-acetaminophen (NORCO/VICODIN) 5-325 MG per tablet 1-2 tablet  1-2 tablet Oral Q4H PRN Gladstone Lighter, MD      . insulin aspart (novoLOG) injection 0-5 Units  0-5 Units Subcutaneous QHS Aldean Jewett, MD   0 Units at 12/25/14 2125  . insulin aspart (novoLOG) injection 0-9 Units  0-9 Units Subcutaneous TID WC Aldean Jewett, MD   1 Units at 12/25/14 1714  . LORazepam (ATIVAN) injection 1 mg  1 mg Intravenous Q6H PRN Gladstone Lighter, MD      . multivitamin with minerals tablet 1 tablet  1 tablet Oral Daily Gladstone Lighter, MD   1 tablet at 12/26/14 1023  . ondansetron (ZOFRAN) tablet 4 mg  4 mg Oral Q6H PRN Gladstone Lighter, MD       Or  .  ondansetron (ZOFRAN) injection 4 mg  4 mg Intravenous Q6H PRN Gladstone Lighter, MD      . polyethylene glycol (MIRALAX / GLYCOLAX) packet 17 g  17 g Oral Daily PRN Gladstone Lighter, MD      . simvastatin (ZOCOR) tablet 20 mg  20 mg Oral QHS Gladstone Lighter, MD   20 mg at 12/25/14 2145  . tamsulosin (FLOMAX) capsule 0.4 mg  0.4 mg Oral Daily Gladstone Lighter, MD   0.4 mg at 12/26/14 1023  . vitamin B-12 (CYANOCOBALAMIN) tablet 1,000 mcg  1,000 mcg Oral Daily Gladstone Lighter, MD   1,000 mcg at 12/26/14 1000  . [START ON 12/29/2014] Vitamin D (Ergocalciferol) (DRISDOL) capsule 50,000 Units  50,000 Units Oral Q Becky Sax, MD       Do not use this list as official medication orders. Please verify with discharge summary.  Discharge Medications:   Medication List    ASK your doctor about these medications        aspirin 325 MG tablet  Take 325 mg by mouth at bedtime.     metFORMIN 500 MG tablet  Commonly known as:  GLUCOPHAGE  Take 500 mg by mouth 2 (two) times daily with a meal.     multivitamin with minerals Tabs tablet  Take 1 tablet by mouth daily.     simvastatin 20 MG tablet  Commonly known as:  ZOCOR  Take 20 mg by mouth at bedtime.     tamsulosin 0.4 MG Caps capsule  Commonly known as:  FLOMAX  Take 0.4 mg by mouth daily.     vitamin B-12 1000 MCG tablet  Commonly known as:  CYANOCOBALAMIN  Take 1,000 mcg by mouth daily.     Vitamin D (Ergocalciferol) 50000 UNITS Caps capsule  Commonly known as:  DRISDOL  Take 50,000 Units by mouth every 7 (seven) days. Pt takes on Sunday.        Relevant Imaging Results:  Relevant Lab Results:  Recent Labs    Additional [place], LCSW

## 2014-12-26 NOTE — Evaluation (Addendum)
Occupational Therapy Evaluation Patient Details Name: Tyler Cordova MRN: [id number] DOB: [birth year] Today's Date: 12/26/2014    History of Present Illness This patient is a 74 year old male with a  history of seizure disorder, Alzheimer's dementia with nonverbal at baseline, newly diagnosed bladder cancer undergoing treatment and was brought to [facility]  left-sided weakness.  As per chart patient was seen by neurologist in the office about 2 months ago for his ongoing seizures.  As per chart, last week, patient woke up and was noticed to be weaker on his left side as he was dragging his left side to walk. Normally his gait is unsteady, but he cruises around the house without any difficulty. He was also noticed to be weaker on his left arm. 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     Clinical Impression   Patient is a 74 year old male with the above history. He had been receiving assist from wife and family for activities of daily living and was able to walk to the bathroom and around the house with hand held assist making caring for him easier. He now needs more assist for ambulation to bathroom and around and his L UE has some tone in the shoulder, but is flaccid distally. He has been fed in the hospital. He would benefit from Occupational Therapy for ADL/functioal mobility training and L UE restoration. He would benefit from skilled nursing facility to get back to the point of being able to be cared for by family.   Follow Up Recommendations  SNF    Equipment Recommendations       Recommendations for Other Services       Precautions / Restrictions Precautions Precautions: Fall Restrictions Weight Bearing Restrictions: No      Mobility Bed Mobility                  Transfers                      Balance                                            ADL                                          General ADL Comments: Assist with ADL exept for he did self feed. He has been fed in the hospital.      Vision     Perception     Praxis      Pertinent Vitals/Pain Pain Assessment:  (unable to say, was some whinsing  with some range of motion of L UE)     Hand Dominance Right   Extremity/Trunk Assessment Upper Extremity Assessment LUE Deficits / Details: Some minimal movement in shoulder, none in elbow, forearm, wrist and hand. Seems to have at least some sensation, but unable to determine. Shoulder limited to 110o of passive flexion and limited to 20o of passive external rotation.Distal motions are WNL passively.   Lower Extremity Assessment Lower Extremity Assessment: Defer to PT evaluation       Communication     Cognition Arousal/Alertness:  (A little sleepy.) Behavior During Therapy:  (confused) Overall Cognitive Status: History of cognitive impairments -  at baseline                     General Comments   Patient hand positioned in elevation to prevent edema which can be caused by a flaccid hand. Son taught the reason for this and nursing notified.    Exercises       Shoulder Instructions      Home Living Family/patient expects to be discharged to:: Skilled nursing facility Living Arrangements: Spouse/significant other Available Help at Discharge:  (normally wife cares for patient)                                    Prior Functioning/Environment Level of Independence: Needs assistance  Gait / Transfers Assistance Needed: Patient was able to walk at home with hand held assist to toilet.  ADL's / Homemaking Assistance Needed: Wife cars for him, He did self feed. Communication / Swallowing Assistance Needed: Patient has been non verbal for a while      OT Diagnosis: Paresis   OT Problem List: Decreased strength;Decreased range of motion;Decreased activity tolerance;Impaired balance (sitting and/or standing);Decreased  coordination;Decreased safety awareness   OT Treatment/Interventions: Self-care/ADL training    OT Goals(Current goals can be found in the care plan section) Acute Rehab OT Goals Patient Stated Goal: unable to state, son would like skilled nursing. OT Goal Formulation: With family Time For Goal Achievement: 01/09/15 Potential to Achieve Goals: Good  OT Frequency: Min 1X/week   Barriers to D/C:            Co-evaluation              End of Session    Activity Tolerance: Patient tolerated treatment well Patient left: in bed;with call bell/phone within reach;with bed alarm set;with family/visitor present   Time: 1035-1100 OT Time Calculation (min): 25 min Charges:  OT General Charges $OT Visit: 1 Procedure OT Evaluation $Initial OT Evaluation Tier I: 1 Procedure OT Treatments $Neuromuscular Re-education: 8-22 mins G-Codes:    Myrene Galas, MS/OTR/L  12/26/2014, 11:31 AM

## 2014-12-26 NOTE — Plan of Care (Signed)
Problem: Discharge Progression Outcomes Goal: Other Discharge Outcomes/Goals Outcome: Not Progressing Pt seen by Neurologist yesterday.  Results of testing revealed a right infarct. Pt unable to follow commands d/t dementia. Incontinent of urine. UA needed. MD notified for order to in and out cath for specimen. Specimen obtained and sent to the lab.  Pt tolerating ordered diet. Meds given in applesauce.

## 2014-12-26 NOTE — Progress Notes (Signed)
CSW confirmed SNF bed at [facility]. SNF began preauth process.  Toma Copier, [place]

## 2014-12-26 NOTE — Clinical Social Work Note (Signed)
Clinical Social Work Assessment  Patient Details  Name: Tyler Cordova MRN: [id number] Date of Birth: 12/26/1940  Date of referral:  12/25/14               Reason for consult:  Facility Placement                Permission sought to share information with:  Case Manager, Facility Sport and exercise psychologist, Family Supports, PCP Permission granted to share information::  Yes, Verbal Permission Granted  Name::        Agency::   SNFS  Relationship::     Contact Information:     Name Relation Home Work Mobile    Teti,George Son [phone]     Javeon, Macmurray Daughter [phone]     [place] Relative [phone]  [phone]   Douglas,Kameron Relative [phone]  [phone]   [place] Spouse [phone]       Housing/Transportation Living arrangements for the past 2 months:  [place] of Information:  Medical Team, Adult Children Patient Interpreter Needed:  None Criminal Activity/Legal Involvement Pertinent to Current Situation/Hospitalization:  No - Comment as needed Significant Relationships:  Adult Children, Spouse Lives with:  Spouse Do you feel safe going back to the place where you live?  Yes Need for family participation in patient care:  Yes (Comment)  Care giving concerns:  Pt requires 24 hour supervision due to advance alzheimer's dementia. Pt lives with his wife of 40+ years and has 3 adult children who assist with his care at home.    Social Worker assessment / plan:  CSW was referred to Pt to assist with dc planning. Pt is from [place] originally, moving to [place], [place] with his wife and 2 older children in 1970's. 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 Pt's youngest son Iona Beard is at bedside. He reports that Pt was diagnosed with dementia in 2007. Pt's son reports that up until this hospitalization, Pt was able to walk and assist with completing some ADLS. Pt has been evaluated by PT/OT and recommendation is SNF. Pt's  family is in agreement with recommendation. CSW will began SNF bed search. Pt's insurance requires preauthorization for SNF which can take 24-36 hours. Pt's family would prefer [facility] at DC due to living near facility.    Employment status:  Retired Nurse, adult PT Recommendations:  [place] / Referral to community resources:  [place]  Patient/Family's Response to care:  Pt's son Iona Beard is at beside. He goes to all of of his father's appointments with his parents to assist with any language barriers and support his parents as needed. Pt is not oriented to situation due to dementia.Pt's son reports that Pt does not recognize his children nor wife anymore.   Patient/Family's Understanding of and Emotional Response to Diagnosis, Current Treatment, and Prognosis:  Pt's son states, "he might not have the same quality of life anymore, but he is a part of our lives." CSW discussed with Pt's son potential needs after rehab, possibility of needing additional assistance at home when he returns or going to ALF before going home from SNF.   Emotional Assessment Appearance:  Appears older than stated age Attitude/Demeanor/Rapport:  Unresponsive Affect (typically observed):  Pleasant Orientation:  Oriented to Self Alcohol / Substance use:  Never Used Psych involvement (Current and /or in the community):  No (Comment)  Discharge Needs  Concerns to be addressed:  Adjustment to Illness, Discharge Planning Concerns Readmission within the last  30 days:  No Current discharge risk:  Cognitively Impaired, Dependent with Mobility Barriers to Discharge:  [place], LCSW 12/26/2014, 9:28 PM

## 2014-12-26 NOTE — Care Management Important Message (Signed)
Important Message  Patient Details  Name: Tyler Cordova MRN: [id number] Date of Birth: Apr 11, 1940   Medicare Important Message Given:  Yes-second notification given    [name] 12/26/2014, 8:40 AM

## 2014-12-26 NOTE — Progress Notes (Signed)
[place] at [place] NAME: Tyler Cordova    MR#:  [id number]  DATE OF BIRTH:  27-Aug-1940  SUBJECTIVE:  CHIEF COMPLAINT:   Chief Complaint  Patient presents with  . Weakness   Does not respond to voice. No distress. Does not follow commands. Eating breakfast with assistance, no choking, enthusiastic eater  REVIEW OF SYSTEMS:   ROS unable to obtain  DRUG ALLERGIES:  No Known Allergies  VITALS:  Blood pressure 108/52, pulse 58, temperature 98.7 F (37.1 C), temperature source Oral, resp. rate 19, height 5\' 9"  (1.753 m), weight 67.631 kg (149 lb 1.6 oz), SpO2 100 %.  PHYSICAL EXAMINATION:  GENERAL:  74 y.o.-year-old patient sitting up being fed EYES: holds eyes closed  HEENT: Head atraumatic, normocephalic. Oropharynx and nasopharynx clear.  NECK:  Supple, no jugular venous distention. No thyroid enlargement, no tenderness. No bruit LUNGS: Normal breath sounds bilaterally, no wheezing, rales,rhonchi or crepitation. No use of accessory muscles of respiration.  CARDIOVASCULAR: S1, S2 normal. No murmurs, rubs, or gallops.  ABDOMEN: Soft, nontender, nondistended. Bowel sounds present. No organomegaly or mass. No guarding no rebound EXTREMITIES: No pedal edema, cyanosis, or clubbing.  NEUROLOGIC: Cranial nerves II through XII are grossly intact. Does not participate in exam  PSYCHIATRIC: The patient keeps his eyes closed, does not respond to voice, withdraws from stimuli SKIN: No obvious rash, lesion, or ulcer.    LABORATORY PANEL:   CBC  Recent Labs Lab 12/26/14 0449  WBC 7.1  HGB 12.1*  HCT 35.0*  PLT 264   ------------------------------------------------------------------------------------------------------------------  Chemistries   Recent Labs Lab 12/26/14 0449  NA 139  K 3.8  CL 104  CO2 27  GLUCOSE 109*  BUN 16  CREATININE 0.85  CALCIUM 8.8*    ------------------------------------------------------------------------------------------------------------------  Cardiac Enzymes No results for input(s): TROPONINI in the last 168 hours. ------------------------------------------------------------------------------------------------------------------  RADIOLOGY:  Ct Head Wo Contrast  12/24/2014  CLINICAL DATA:  Left upper extremity weakness EXAM: CT HEAD WITHOUT CONTRAST TECHNIQUE: Contiguous axial images were obtained from the base of the skull through the vertex without intravenous contrast. COMPARISON:  10/14/2014 FINDINGS: Skull and Sinuses:Negative for fracture or destructive process. The mastoids, middle ears, and imaged paranasal sinuses are clear. Orbits: No acute abnormality. Brain: No explanation for left upper extremity weakness. No evidence of acute infarct, hemorrhage, hydrocephalus, or mass lesion. Small-vessel ischemic changes in the right thalamus and bilateral basal ganglia have a stable appearance compared to prior. Ischemic gliosis is confluent throughout the bilateral cerebral white matter. Advanced generalized atrophy with prominent mesial temporal involvement correlating with history of Alzheimer's disease. IMPRESSION: 1. No acute finding. 2. Atrophy and chronic ischemic injury as described above. Electronically Signed   By: Monte Fantasia M.D.   On: 12/24/2014 16:52   [name] UX Contrast  12/25/2014  CLINICAL DATA:  Seizures. Left arm and left leg weakness for 1 week. History of Alzheimer's. EXAM: MRI HEAD WITHOUT AND WITH CONTRAST TECHNIQUE: Multiplanar, multiecho pulse sequences of the brain and surrounding structures were obtained without and with intravenous contrast. CONTRAST:  53mL MULTIHANCE GADOBENATE DIMEGLUMINE 529 MG/ML IV SOLN COMPARISON:  Head CT 12/24/2014 FINDINGS: The study is mildly to moderately motion degraded despite utilizing faster, more motion resistant imaging protocols. There is a small acute  right MCA territory cortical infarct involving the postcentral greater than precentral gyri. There is no associated hemorrhage. Gyral susceptibility artifact overlying the posterior left frontal lobe at the skull vertex is suggestive of  chronic hemosiderin staining related to remote subarachnoid hemorrhage. There is advanced cerebral atrophy. Dedicated imaging through the temporal lobes is severely motion degraded, precluding detailed evaluation of the hippocampi, however there is prominent mesial temporal lobe atrophy compatible with the history of Alzheimer's disease. No abnormal enhancement is identified. Subcortical and periventricular white matter T2 hyperintensities are nonspecific but compatible with advanced chronic small vessel ischemia. Chronic ischemic changes are also noted in the deep gray nuclei bilaterally. Orbits are grossly unremarkable. No gross inflammatory changes are seen in the paranasal sinuses or mastoid air cells. Major intracranial vascular flow voids are grossly preserved, with the left vertebral artery appearing dominant. IMPRESSION: 1. Motion degraded examination. 2. Small right MCA territory perirolandic infarct. 3. Advanced cerebral atrophy and chronic small vessel ischemic disease. Electronically Signed   By: Logan Bores M.D.   On: 12/25/2014 12:41   US Carotid Bilateral  12/25/2014  CLINICAL DATA:  74 year old male with a history of cerebral vascular accident. Cardiovascular risk factors include hyperlipidemia and diabetes. EXAM: BILATERAL CAROTID DUPLEX ULTRASOUND TECHNIQUE: Pearline Cables scale imaging, color Doppler and duplex ultrasound were performed of bilateral carotid and vertebral arteries in the neck. COMPARISON:  No prior duplex FINDINGS: Criteria: Quantification of carotid stenosis is based on velocity parameters that correlate the residual internal carotid diameter with NASCET-based stenosis levels, using the diameter of the distal internal carotid lumen as the denominator  for stenosis measurement. The following velocity measurements were obtained: RIGHT ICA:  Systolic 53 cm/sec, Diastolic 12 cm/sec CCA:  49 cm/sec SYSTOLIC ICA/CCA RATIO:  1.1 ECA:  76 cm/sec LEFT ICA:  Systolic 59 cm/sec, Diastolic 12 cm/sec CCA:  49 cm/sec SYSTOLIC ICA/CCA RATIO:  1.0 ECA:  79 cm/sec Right Brachial SBP: Not acquired Left Brachial SBP: Not acquired RIGHT CAROTID ARTERY: No significant calcifications of the right common carotid artery. Intermediate waveform maintained. Heterogeneous and partially calcified plaque at the right carotid bifurcation. No significant lumen shadowing. Low resistance waveform of the right ICA. Tortuous carotid system. RIGHT VERTEBRAL ARTERY: Antegrade flow with low resistance waveform. LEFT CAROTID ARTERY: No significant calcifications of the left common carotid artery. Intermediate waveform maintained. Heterogeneous and partially calcified plaque at the left carotid bifurcation without significant lumen shadowing. Low resistance waveform of the left ICA. Tortuous carotid system. LEFT VERTEBRAL ARTERY:  Antegrade flow with low resistance waveform. IMPRESSION: Color duplex indicates minimal heterogeneous and calcified plaque, with no hemodynamically significant stenosis by duplex criteria in the extracranial cerebrovascular circulation. Signed, Dulcy Fanny. Earleen [place], DO [facility] Electronically Signed   By: Corrie Mckusick D.O.   On: 12/25/2014 15:37    EKG:   Orders placed or performed during the hospital encounter of 12/24/14  . EKG 12-Lead  . EKG 12-Lead    ASSESSMENT AND PLAN:   1) acute small right MCA infarct: - no events on telemetry and carotid dopplers are negative - ECHO 2 weeks ago, will not repeat - neurology consultation appriciated, will continue aspirin and statin  2) dementia - at baseline is non verbal and disoriented.  He does walk and feed himself, but with significant help from family.   Wife is primary care giver but has several children that help. - has not benefited from medications - follows with Dr Manuella Ghazi, neurology - Will need SNF placement. CSW following. Family available to discuss.  3) seizure disorder - family states that he has occasional seizures with post ictal "comatose" state that lasts for a day or so.  Not on any  seizure meds - EEG pending  4) diabetes mellitus type 2: cbg's controlled -  A1C 7.6, continue sliding scale  5) bladder cancer: He was to start intravesicular treatments this week. 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 Management plans discussed with the patient, family and they are in agreement.  CODE STATUS: Full   TOTAL TIME TAKING CARE OF THIS PATIENT: 40 minutes.  Greater than 50% of time spent in care coordination and counseling. Updated son Iona Beard on the phone today. Patient will be stable for DC tomorrow. I am not sure how he will do in SNF, it will take a considerable level of care to get him to participate in rehab activities due to his dementia.  Currently his wife and son are able to get him up to walk at home. POSSIBLE D/C IN 1 DAYS, DEPENDING ON CLINICAL CONDITION.   Myrtis Ser M.D on 12/26/2014 at 1:33 PM  Between 7am to 6pm - Pager - [phone]  After 6pm go to [email or web address] - password [email or web address] [facility]  Office  [phone]  CC: Primary care physician; Lenard Simmer, MD

## 2014-12-27 DIAGNOSIS — N390 Urinary tract infection, site not specified: Secondary | ICD-10-CM

## 2014-12-27 LAB — GLUCOSE, CAPILLARY
GLUCOSE-CAPILLARY: 109 mg/dL — AB (ref 65–99)
GLUCOSE-CAPILLARY: 128 mg/dL — AB (ref 65–99)
Glucose-Capillary: 175 mg/dL — ABNORMAL HIGH (ref 65–99)

## 2014-12-27 MED ORDER — SULFAMETHOXAZOLE-TRIMETHOPRIM 800-160 MG PO TABS: 1.0000 | ORAL_TABLET | Freq: Two times a day (BID) | ORAL | Status: AC

## 2014-12-27 NOTE — Discharge Instructions (Signed)
°  DIET:  Diabetic diet  DISCHARGE CONDITION:  Stable  ACTIVITY:  Activity as tolerated  OXYGEN:  Home Oxygen: No.   Oxygen Delivery: room air  DISCHARGE LOCATION:  nursing home   If you experience worsening of your admission symptoms, develop shortness of breath, life threatening emergency, suicidal or homicidal thoughts you must seek medical attention immediately by calling 911 or calling your MD immediately  if symptoms less severe.  You Must read complete instructions/literature along with all the possible adverse reactions/side effects for all the Medicines you take and that have been prescribed to you. Take any new Medicines after you have completely understood and accpet all the possible adverse reactions/side effects.   Please note  You were cared for by a hospitalist during your hospital stay. If you have any questions about your discharge medications or the care you received while you were in the hospital after you are discharged, you can call the unit and asked to speak with the hospitalist on call if the hospitalist that took care of you is not available. Once you are discharged, your primary care physician will handle any further medical issues. Please note that NO REFILLS for any discharge medications will be authorized once you are discharged, as it is imperative that you return to your primary care physician (or establish a relationship with a primary care physician if you do not have one) for your aftercare needs so that they can reassess your need for medications and monitor your lab values.

## 2014-12-27 NOTE — Discharge Summary (Signed)
[facility] NAME: Tyler Cordova    MR#:  [id number]  DATE OF BIRTH:  1941-02-16  DATE OF ADMISSION:  12/24/2014 ADMITTING PHYSICIAN: Gladstone Lighter, MD  DATE OF DISCHARGE: 12/27/2014  PRIMARY CARE PHYSICIAN: Lenard Simmer, MD    ADMISSION DIAGNOSIS:  Left-sided weakness [M62.89]  DISCHARGE DIAGNOSIS:  Active Problems:   Ureteral cancer (HCC)   Seizures (HCC)   Alzheimer's dementia   Diabetes mellitus without complication ([place])   Bladder cancer ([place])   Hyperlipidemia   UTI (urinary tract infection)   SECONDARY DIAGNOSIS:   Past Medical History  Diagnosis Date  . Seizures ([place])     Per family, patient has epileptic seizures; but he does not take medicines for it;   . Alzheimer's dementia     Rapidly progressive Alzheimer's Dementia. Still at home but now with increasing behavioral problems. Uses Diapers for Bowel and Urinary leakage. Wife normally takes care of him at home but she and most of his family are in [place] for an extended period of time so he is staying at [facility] assisted living for time being  . Diabetes mellitus without complication ([place])     Dx 10/27/2012, followed with Delrae Rend MD   . Bladder cancer [facility])     Multifocal Low-Grade Transitional Cell Carcinoma; followed by Ivin Booty, MD([facility])  . Hyperlipidemia     On statin; dx 10/27/2012, followed with Delrae Rend MD    . 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 Dementia     HOSPITAL COURSE:    1) acute small right MCA infarct: Seen on MRI. This is the likely cause of his acute left-sided weakness. There have been no events on telemetry and carotid dopplers are negative. ECHO 2 weeks ago, not repeated. Neurology consultation appreciated. Will continue aspirin and statin. He does have episodes of paroxysmal atrial fibrillation but would not recommend  anticoagulation in this patient. Will need skilled nursing facility placement due to ongoing left-sided weakness. Will require intensive rehabilitation  2) dementia: At baseline is non verbal and disoriented. He does walk and feed himself, but with significant help from family. Wife is primary care giver but has several children that help. Dementia has not improved with medication. Follows with Dr Manuella Ghazi, neurology.   3) seizure disorder: Family states that he has occasional seizures with post ictal "comatose" state that lasts for a day or so. Not on any seizure meds. EEG pending. No evidence of seizure during this hospitalization  4) diabetes mellitus type 2: A1C 7.6. Would continue with metformin and carbohydrate modified diet.  5) bladder cancer: He was to start intravesicular treatments this week. He will need to continue to follow up with [facility].  6) BPH: Continue Flomax  7) urinary tract infection: This may be contributing to some of his more acute confusion and fatigue. Urine growing gram negative rods. He has been treated with Rocephin inpatient and will transition to Bactrim on discharge to complete 7 day course. He will need to follow-up with urology.  DISCHARGE CONDITIONS:   Stable  CONSULTS OBTAINED:  Treatment Team:  Leotis Pain, MD  DRUG ALLERGIES:  No Known Allergies  DISCHARGE MEDICATIONS:   Current Discharge Medication List    START taking these medications   Details  sulfamethoxazole-trimethoprim (BACTRIM DS,SEPTRA DS) 800-160 MG tablet Take 1 tablet by mouth 2 (two) times daily. Qty:  10 tablet, Refills: 0      CONTINUE these medications which have NOT CHANGED   Details  aspirin 325 MG tablet Take 325 mg by mouth at bedtime.    metFORMIN (GLUCOPHAGE) 500 MG tablet Take 500 mg by mouth 2 (two) times daily with a meal.     Multiple Vitamin (MULTIVITAMIN WITH MINERALS) TABS tablet Take 1 tablet by mouth daily.    simvastatin (ZOCOR)  20 MG tablet Take 20 mg by mouth at bedtime.    tamsulosin (FLOMAX) 0.4 MG CAPS capsule Take 0.4 mg by mouth daily.    vitamin B-12 (CYANOCOBALAMIN) 1000 MCG tablet Take 1,000 mcg by mouth daily.    Vitamin D, Ergocalciferol, (DRISDOL) 50000 UNITS CAPS capsule Take 50,000 Units by mouth every 7 (seven) days. Pt takes on Sunday.         DISCHARGE INSTRUCTIONS:    Stable condition. Carbohydrate modified diet. Will need intensive physical therapy on discharge.  Follow-up with urology in 1-2 weeks for continued treatment of bladder cancer  If you experience worsening of your admission symptoms, develop shortness of breath, life threatening emergency, suicidal or homicidal thoughts you must seek medical attention immediately by calling 911 or calling your MD immediately  if symptoms less severe.  You Must read complete instructions/literature along with all the possible adverse reactions/side effects for all the Medicines you take and that have been prescribed to you. Take any new Medicines after you have completely understood and accept all the possible adverse reactions/side effects.   Please note  You were cared for by a hospitalist during your hospital stay. If you have any questions about your discharge medications or the care you received while you were in the hospital after you are discharged, you can call the unit and asked to speak with the hospitalist on call if the hospitalist that took care of you is not available. Once you are discharged, your primary care physician will handle any further medical issues. Please note that NO REFILLS for any discharge medications will be authorized once you are discharged, as it is imperative that you return to your primary care physician (or establish a relationship with a primary care physician if you do not have one) for your aftercare needs so that they can reassess your need for medications and monitor your lab values.    Today   CHIEF  COMPLAINT:   Chief Complaint  Patient presents with  . Weakness    HISTORY OF PRESENT ILLNESS:  Tyler Cordova is a 74 y.o. male with a known history of seizure disorder, not on any antiepileptics, severe Alzheimer's dementia with nonverbal at baseline, newly diagnosed bladder cancer undergoing treatment brought in from neurology office for left-sided weakness. Patient is unable to provide any history, his son at bedside and provides most of the history. And history is also obtained from Dr. Guinevere Scarlet notes. Patient was seen by neurologist in the office about 2 months ago for his ongoing seizures, at that time decision was made not to start any antiepileptics on him. According to son patient seizures are followed by a period of decreased responsiveness which recovers after 24-48 hours. Last week, patient woke up and was noticed to be weaker on his left side as he was dragging his left side to walk. Normally his gait is unsteady, but he cruises around the house without any difficulty. He was also noticed to be weaker on his left arm. Patient is a right-handed person. So the family thought maybe he  has had a seizure in sleep, and waited for his symptoms to improve. After a couple of days he started regaining function of his left side but is not back to normal. He's been noted to have fluctuating weakness on that side. No obvious seizures noted by the family. Since his weakness persisted even after a week, he was taken to neurology office and was recommended to come to the emergency room. According to family, patient has been eating without any signs of dysphagia. CT of the head here shows severe generalized atrophy of his brain but no acute infarct.   VITAL SIGNS:  Blood pressure 121/61, pulse 58, temperature 98.4 F (36.9 C), temperature source Oral, resp. rate 18, height 5\' 9"  (1.753 m), weight 67.631 kg (149 lb 1.6 oz), SpO2 97 %.  I/O:    Intake/Output Summary (Last 24 hours) at 12/27/14  1023 Last data filed at 12/27/14 0800  Gross per 24 hour  Intake    480 ml  Output      0 ml  Net    480 ml    PHYSICAL EXAMINATION:  GENERAL:  74 y.o.-year-old patient sitting up, being fed breakfast, no distress EYES: Pupils equal, round, reactive to light and accommodation. No scleral icterus. Extraocular muscles intact.  HEENT: Head atraumatic, normocephalic. Oropharynx and nasopharynx clear. Mucous membranes are moist NECK:  Supple, no jugular venous distention. No thyroid enlargement, no tenderness.  LUNGS: Normal breath sounds bilaterally, no wheezing, rales,rhonchi or crepitation. No use of accessory muscles of respiration.  CARDIOVASCULAR: S1, S2 normal. No murmurs, rubs, or gallops.  ABDOMEN: Soft, non-tender, non-distended. Bowel sounds present. No organomegaly or mass.  EXTREMITIES: No pedal edema, cyanosis, or clubbing. Peripheral pulses 2+ NEUROLOGIC: Cranial nerves II through XII are grossly intact. Muscle strength 5/5 in all extremities with the exception of the left arm, biceps and triceps strength 5 out of 5 grip strength is minimal.  PSYCHIATRIC: The patient is alert, nonverbal, does not seem oriented but is pleasant SKIN: No obvious rash, lesion, or ulcer.   DATA REVIEW:   CBC  Recent Labs Lab 12/26/14 0449  WBC 7.1  HGB 12.1*  HCT 35.0*  PLT 264    Chemistries   Recent Labs Lab 12/26/14 0449  NA 139  K 3.8  CL 104  CO2 27  GLUCOSE 109*  BUN 16  CREATININE 0.85  CALCIUM 8.8*    Cardiac Enzymes No results for input(s): TROPONINI in the last 168 hours.  Microbiology Results  Results for orders placed or performed during the hospital encounter of 12/24/14  Urine culture     Status: None (Preliminary result)   Collection Time: 12/26/14  4:14 AM  Result Value Ref Range Status   Specimen Description URINE, RANDOM  Final   Special Requests NONE  Final   Culture   Final    >=100,000 COLONIES/mL GRAM NEGATIVE RODS IDENTIFICATION AND  SUSCEPTIBILITIES TO FOLLOW    Report Status PENDING  Incomplete    RADIOLOGY:  Mr Kizzie Fantasia Contrast  12/25/2014  CLINICAL DATA:  Seizures. Left arm and left leg weakness for 1 week. History of Alzheimer's. EXAM: MRI HEAD WITHOUT AND WITH CONTRAST TECHNIQUE: Multiplanar, multiecho pulse sequences of the brain and surrounding structures were obtained without and with intravenous contrast. CONTRAST:  43mL MULTIHANCE GADOBENATE DIMEGLUMINE 529 MG/ML IV SOLN COMPARISON:  Head CT 12/24/2014 FINDINGS: The study is mildly to moderately motion degraded despite utilizing faster, more motion resistant imaging protocols. There is a small acute right MCA  territory cortical infarct involving the postcentral greater than precentral gyri. There is no associated hemorrhage. Gyral susceptibility artifact overlying the posterior left frontal lobe at the skull vertex is suggestive of chronic hemosiderin staining related to remote subarachnoid hemorrhage. There is advanced cerebral atrophy. Dedicated imaging through the temporal lobes is severely motion degraded, precluding detailed evaluation of the hippocampi, however there is prominent mesial temporal lobe atrophy compatible with the history of Alzheimer's disease. No abnormal enhancement is identified. Subcortical and periventricular white matter T2 hyperintensities are nonspecific but compatible with advanced chronic small vessel ischemia. Chronic ischemic changes are also noted in the deep gray nuclei bilaterally. Orbits are grossly unremarkable. No gross inflammatory changes are seen in the paranasal sinuses or mastoid air cells. Major intracranial vascular flow voids are grossly preserved, with the left vertebral artery appearing dominant. IMPRESSION: 1. Motion degraded examination. 2. Small right MCA territory perirolandic infarct. 3. Advanced cerebral atrophy and chronic small vessel ischemic disease. Electronically Signed   By: Logan Bores M.D.   On: 12/25/2014  12:41   US Carotid Bilateral  12/25/2014  CLINICAL DATA:  74 year old male with a history of cerebral vascular accident. Cardiovascular risk factors include hyperlipidemia and diabetes. EXAM: BILATERAL CAROTID DUPLEX ULTRASOUND TECHNIQUE: Pearline Cables scale imaging, color Doppler and duplex ultrasound were performed of bilateral carotid and vertebral arteries in the neck. COMPARISON:  No prior duplex FINDINGS: Criteria: Quantification of carotid stenosis is based on velocity parameters that correlate the residual internal carotid diameter with NASCET-based stenosis levels, using the diameter of the distal internal carotid lumen as the denominator for stenosis measurement. The following velocity measurements were obtained: RIGHT ICA:  Systolic 53 cm/sec, Diastolic 12 cm/sec CCA:  49 cm/sec SYSTOLIC ICA/CCA RATIO:  1.1 ECA:  76 cm/sec LEFT ICA:  Systolic 59 cm/sec, Diastolic 12 cm/sec CCA:  49 cm/sec SYSTOLIC ICA/CCA RATIO:  1.0 ECA:  79 cm/sec Right Brachial SBP: Not acquired Left Brachial SBP: Not acquired RIGHT CAROTID ARTERY: No significant calcifications of the right common carotid artery. Intermediate waveform maintained. Heterogeneous and partially calcified plaque at the right carotid bifurcation. No significant lumen shadowing. Low resistance waveform of the right ICA. Tortuous carotid system. RIGHT VERTEBRAL ARTERY: Antegrade flow with low resistance waveform. LEFT CAROTID ARTERY: No significant calcifications of the left common carotid artery. Intermediate waveform maintained. Heterogeneous and partially calcified plaque at the left carotid bifurcation without significant lumen shadowing. Low resistance waveform of the left ICA. Tortuous carotid system. LEFT VERTEBRAL ARTERY:  Antegrade flow with low resistance waveform. IMPRESSION: Color duplex indicates minimal heterogeneous and calcified plaque, with no hemodynamically significant stenosis by duplex criteria in the extracranial cerebrovascular circulation.  Signed, Dulcy Fanny. Earleen Newport, DO [facility] Electronically Signed   By: Corrie Mckusick D.O.   On: 12/25/2014 15:37    EKG:   Orders placed or performed during the hospital encounter of 12/24/14  . EKG 12-Lead  . EKG 12-Lead      Management plans discussed with the patient, family and they are in agreement.  CODE STATUS:     Code Status Orders        Start     Ordered   12/24/14 2048  Full code   Continuous     12/24/14 2048    Advance Directive Documentation        Most Recent Value   Type of Advance Directive  Healthcare Power of Attorney   Pre-existing out of facility DNR order (yellow form or pink MOST form)     "  MOST" Form in Place?        TOTAL TIME TAKING CARE OF THIS PATIENT: 45 minutes.  Greater than 50% of time spent in care coordination and counseling.  Myrtis Ser M.D on 12/27/2014 at 10:23 AM  Between 7am to 6pm - Pager - [phone]  After 6pm go to [email or web address] - password [email or web address] [facility]  Office  [phone]  CC: Primary care physician; Lenard Simmer, MD

## 2014-12-27 NOTE — Progress Notes (Addendum)
CSW followed up with [facility] for authorization for SNF. [employer] rep stated that they did not get clinicals due to new system issues at [facility], [place] faxed info directly to rep for review. CSW updated EWP.   Toma Copier, [place]

## 2014-12-27 NOTE — Care Management (Signed)
Spoke with son, Collier [place], at the bedside. States that he would like to have [facility] for services in the home, if [facility] doesn't approved his father's admission into a skilled nursing facility.  Shelbie Ammons RN MSN CCM Care Management [phone]

## 2014-12-27 NOTE — Progress Notes (Signed)
Occupational Therapy Treatment Patient Details Name: Tyler Cordova MRN: [id number] DOB: [birth year] Today's Date: 12/27/2014    History of present illness 74 y.o. male with a known history of seizure disorder, not on any antiepileptics, severe Alzheimer's dementia with nonverbal at baseline, newly diagnosed bladder cancer undergoing treatment brought in from neurology office for left-sided weakness. Patient is unable to provide any history, his son at bedside and provides most of the history. And history is also obtained from Dr. Guinevere Scarlet notes. Patient was seen by neurologist in the office about 2 months ago for his ongoing seizures, at that time decision was made not to start any antiepileptics on him. According to son patient seizures are followed by a period of decreased responsiveness which recovers after 24-48 hours. Last week, patient woke up and was noticed to be weaker on his left side as he was dragging his left side to walk. Normally his gait is unsteady, but he cruises around the house without any difficulty. He was also noticed to be weaker on his left arm. 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     OT comments  Patient demonstrating more tone in L UE in synergy pattern. Patient stretched into reflex inhibiting pattern through out session to aid in normalizing tone. Stretching  L upper extremity into shoulder flexion, external and internal rotation elbow flexion and extension, forearm supination and pronation, wrist flexion and extension, and hand flexion and extension. Shoulder flexion limited to 120o and external rotation to 35o from tone and some resistance. Tapping to finger extensors produced extension but nothing volitional.  Follow Up Recommendations  SNF    Equipment Recommendations       Recommendations for Other Services      Precautions / Restrictions Precautions Precautions: Fall Restrictions Weight Bearing Restrictions: No       Mobility  Bed Mobility                  Transfers                      Balance                                   ADL                                                Vision                     Perception     Praxis      Cognition                             Extremity/Trunk Assessment               Exercises     Shoulder Instructions       General Comments      Pertinent Vitals/ Pain          Home Living                                          Prior Functioning/Environment  Frequency       Progress Toward Goals  OT Goals(current goals can now be found in the care plan section)        Plan      Co-evaluation                 End of Session     Activity Tolerance     Patient Left in chair;with call bell/phone within reach;with chair alarm set   Nurse Communication          Time: 1105-1120 OT Time Calculation (min): 15 min  Charges: OT General Charges $OT Visit: 1 Procedure OT Treatments $Neuromuscular Re-education: 8-22 mins [place], MS/OTR/L  Valente David M 12/27/2014, 11:27 AM

## 2014-12-27 NOTE — Progress Notes (Signed)
CSW followed up with [employer] representative to discuss authorization. [employer] reprentative stated that case would be sent to medical director for review due to Pt's ability to "learn" and "communicate" with therapist. CSW advocated on behalf of patient and family for opportunity to attempt to rehab some of his deficits from the stroke.  Toma Copier, [place]

## 2014-12-27 NOTE — Plan of Care (Signed)
Problem: Discharge Progression Outcomes Goal: Other Discharge Outcomes/Goals Outcome: Completed/Met Date Met:  12/27/14 VSS. No signs of pain nor discomfort. No neuro changes during the shift. Discharge packet and discharge instructions given and explained to pt's son. Prescription for bactrim given. Educational handout about stroke given and explained to pt's son. Pt is discharge to [facility]. Son is taking the pt to the facility. Report given to [place], Therapist, sports.

## 2014-12-27 NOTE — Plan of Care (Signed)
Problem: Discharge Progression Outcomes Goal: Other Discharge Outcomes/Goals Outcome: Progressing Discharge plan: Pt awaiting placement to snf. Complications: S/p right infarct. Worked with PT yesterday. Left sided weakness. 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  Pt on Dysphagia 3.Needs assistance with meals

## 2014-12-28 DIAGNOSIS — Z794 Long term (current) use of insulin: Secondary | ICD-10-CM | POA: Diagnosis not present

## 2014-12-28 DIAGNOSIS — E119 Type 2 diabetes mellitus without complications: Secondary | ICD-10-CM | POA: Diagnosis not present

## 2014-12-28 LAB — URINE CULTURE: Culture: >=100000

## 2014-12-28 LAB — GLUCOSE, CAPILLARY
GLUCOSE-CAPILLARY: 164 mg/dL — AB (ref 65–99)
Glucose-Capillary: 145 mg/dL — ABNORMAL HIGH (ref 65–99)

## 2014-12-29 LAB — GLUCOSE, CAPILLARY
GLUCOSE-CAPILLARY: 108 mg/dL — AB (ref 65–99)
Glucose-Capillary: 130 mg/dL — ABNORMAL HIGH (ref 65–99)

## 2014-12-30 DIAGNOSIS — E119 Type 2 diabetes mellitus without complications: Secondary | ICD-10-CM | POA: Diagnosis not present

## 2014-12-30 LAB — GLUCOSE, CAPILLARY
GLUCOSE-CAPILLARY: 112 mg/dL — AB (ref 65–99)
GLUCOSE-CAPILLARY: 185 mg/dL — AB (ref 65–99)
GLUCOSE-CAPILLARY: 130 mg/dL — AB (ref 65–99)
Glucose-Capillary: 161 mg/dL — ABNORMAL HIGH (ref 65–99)
Glucose-Capillary: 122 mg/dL — ABNORMAL HIGH (ref 65–99)
Glucose-Capillary: 162 mg/dL — ABNORMAL HIGH (ref 65–99)
Glucose-Capillary: 161 mg/dL — ABNORMAL HIGH (ref 65–99)

## 2014-12-31 ENCOUNTER — Encounter
Admission: RE | Admit: 2014-12-31 | Discharge: 2014-12-31 | Disposition: A | Discharge status: Home / Self Care | Payer: Medicare PPO | Source: Ambulatory Visit | Attending: Internal Medicine | Admitting: Internal Medicine

## 2014-12-31 DIAGNOSIS — Z794 Long term (current) use of insulin: Secondary | ICD-10-CM | POA: Diagnosis not present

## 2014-12-31 DIAGNOSIS — E119 Type 2 diabetes mellitus without complications: Secondary | ICD-10-CM | POA: Insufficient documentation

## 2014-12-31 LAB — GLUCOSE, CAPILLARY
GLUCOSE-CAPILLARY: 106 mg/dL — AB (ref 65–99)
Glucose-Capillary: 89 mg/dL (ref 65–99)
Glucose-Capillary: 147 mg/dL — ABNORMAL HIGH (ref 65–99)
Glucose-Capillary: 142 mg/dL — ABNORMAL HIGH (ref 65–99)

## 2015-01-01 ENCOUNTER — Encounter: Payer: Self-pay | Admitting: Emergency Medicine

## 2015-01-01 ENCOUNTER — Emergency Department
Admission: EM | Admit: 2015-01-01 | Discharge: 2015-01-01 | Disposition: A | Discharge status: Home / Self Care | Payer: Medicare PPO | Attending: Emergency Medicine | Admitting: Emergency Medicine

## 2015-01-01 ENCOUNTER — Emergency Department: Payer: Medicare PPO

## 2015-01-01 DIAGNOSIS — E119 Type 2 diabetes mellitus without complications: Secondary | ICD-10-CM | POA: Diagnosis not present

## 2015-01-01 DIAGNOSIS — R05 Cough: Secondary | ICD-10-CM | POA: Insufficient documentation

## 2015-01-01 DIAGNOSIS — R569 Unspecified convulsions: Secondary | ICD-10-CM | POA: Diagnosis present

## 2015-01-01 DIAGNOSIS — G40909 Epilepsy, unspecified, not intractable, without status epilepticus: Secondary | ICD-10-CM | POA: Insufficient documentation

## 2015-01-01 DIAGNOSIS — F0280 Dementia in other diseases classified elsewhere without behavioral disturbance: Secondary | ICD-10-CM | POA: Insufficient documentation

## 2015-01-01 DIAGNOSIS — G309 Alzheimer's disease, unspecified: Secondary | ICD-10-CM | POA: Diagnosis not present

## 2015-01-01 DIAGNOSIS — Z7982 Long term (current) use of aspirin: Secondary | ICD-10-CM | POA: Diagnosis not present

## 2015-01-01 DIAGNOSIS — Z79899 Other long term (current) drug therapy: Secondary | ICD-10-CM | POA: Insufficient documentation

## 2015-01-01 DIAGNOSIS — R4182 Altered mental status, unspecified: Secondary | ICD-10-CM | POA: Insufficient documentation

## 2015-01-01 LAB — GLUCOSE, CAPILLARY
GLUCOSE-CAPILLARY: 139 mg/dL — AB (ref 65–99)
Glucose-Capillary: 120 mg/dL — ABNORMAL HIGH (ref 65–99)
Glucose-Capillary: 96 mg/dL (ref 65–99)

## 2015-01-01 LAB — CBC WITH DIFFERENTIAL/PLATELET
BASOS ABS: 0 10*3/uL (ref 0–0.1)
BASOS PCT: 0 %
Eosinophils Absolute: 0.1 10*3/uL (ref 0–0.7)
Eosinophils Relative: 2 %
HEMATOCRIT: 37 % — AB (ref 40.0–52.0)
HEMOGLOBIN: 12.1 g/dL — AB (ref 13.0–18.0)
Lymphocytes Relative: 16 %
Lymphs Abs: 1.1 10*3/uL (ref 1.0–3.6)
MCH: 28.8 pg (ref 26.0–34.0)
MCHC: 32.7 g/dL (ref 32.0–36.0)
MCV: 88.2 fL (ref 80.0–100.0)
MONO ABS: 0.3 10*3/uL (ref 0.2–1.0)
Monocytes Relative: 5 %
NEUTROS ABS: 5.4 10*3/uL (ref 1.4–6.5)
NEUTROS PCT: 77 %
Platelets: 243 10*3/uL (ref 150–440)
RBC: 4.2 MIL/uL — AB (ref 4.40–5.90)
RDW: 14.6 % — AB (ref 11.5–14.5)
WBC: 7 10*3/uL (ref 3.8–10.6)

## 2015-01-01 LAB — BASIC METABOLIC PANEL
ANION GAP: 8 (ref 5–15)
BUN: 21 mg/dL — ABNORMAL HIGH (ref 6–20)
CALCIUM: 8.7 mg/dL — AB (ref 8.9–10.3)
CO2: 23 mmol/L (ref 22–32)
Chloride: 104 mmol/L (ref 101–111)
Creatinine, Ser: 1.07 mg/dL (ref 0.61–1.24)
Glucose, Bld: 138 mg/dL — ABNORMAL HIGH (ref 65–99)
POTASSIUM: 4.3 mmol/L (ref 3.5–5.1)
SODIUM: 135 mmol/L (ref 135–145)

## 2015-01-01 LAB — URINALYSIS COMPLETE WITH MICROSCOPIC (ARMC ONLY)
Bacteria, UA: NONE SEEN
Bilirubin Urine: NEGATIVE
GLUCOSE, UA: NEGATIVE mg/dL
Hgb urine dipstick: NEGATIVE
KETONES UR: NEGATIVE mg/dL
LEUKOCYTES UA: NEGATIVE
NITRITE: NEGATIVE
PROTEIN: NEGATIVE mg/dL
SPECIFIC GRAVITY, URINE: 1.012 (ref 1.005–1.030)
Squamous Epithelial / LPF: NONE SEEN
pH: 6 (ref 5.0–8.0)

## 2015-01-01 LAB — TROPONIN I: Troponin I: <0.03 ng/mL (ref <0.031)

## 2015-01-01 MED ORDER — SODIUM CHLORIDE 0.9 % IV SOLN
1000.0000 mg | Freq: Once | INTRAVENOUS | Status: AC
  Administered 2015-01-01: 1000 mg via INTRAVENOUS
  Filled 2015-01-01 (×2): qty 20

## 2015-01-01 MED ORDER — PHENYTOIN SODIUM EXTENDED 100 MG PO CAPS: 300.0000 mg | ORAL_CAPSULE | Freq: Every day | ORAL | Status: AC

## 2015-01-01 NOTE — ED Notes (Addendum)
Pt presents via EMS from the [facility]. [facility] staff report witnessing pt have a seizure. EMS reports no seizure activity. Pt has history of dementia and CVA. EMS 120/70, FSBS 152. 94% RA.

## 2015-01-01 NOTE — Discharge Instructions (Signed)
You were evaluated after seizure, and your exam and evaluation are reassuring today. In discussion with the neurologist, you're being started on Dilantin 300 mg in the evenings. You were given a loading dose in the emergency department of 1000 mg IV.  Seizure, Adult A seizure means there is unusual activity in the brain. A seizure can cause changes in attention or behavior. Seizures often cause shaking (convulsions). Seizures often last from 30 seconds to 2 minutes. HOME CARE   If you are given medicines, take them exactly as told by your doctor.  Keep all doctor visits as told.  Do not swim or drive until your doctor says it is okay.  Teach others what to do if you have a seizure. They should:  Lay you on the ground.  Put a cushion under your head.  Loosen any tight clothing around your neck.  Turn you on your side.  Stay with you until you get better. GET HELP RIGHT AWAY IF:   The seizure lasts longer than 2 to 5 minutes.  The seizure is very bad.  The person does not wake up after the seizure.  The person's attention or behavior changes. Drive the person to the emergency room or call your local emergency services (911 in [place] MAKE SURE YOU:   Understand these instructions.  Will watch your condition.  Will get help right away if you are not doing well or get worse.   This information is not intended to replace advice given to you by your health care provider. Make sure you discuss any questions you have with your health care provider.   Document Released: 08/04/2007 Document Revised: 05/10/2011 Document Reviewed: 09/27/2012 Elsevier Interactive Patient Education [employer].

## 2015-01-01 NOTE — ED Notes (Signed)
Son leaves his number to call when pt is being trasnferred. Iona Beard at [phone]

## 2015-01-01 NOTE — ED Notes (Signed)
Son called and made aware of pt leaving with EMS

## 2015-01-01 NOTE — ED Provider Notes (Signed)
[facility] Emergency Department Provider Note   ____________________________________________  Time seen:  I have reviewed the triage vital signs and the triage nursing note.  HISTORY  Chief Complaint Seizures   Historian Patient is a limited historian as he has altered mental status Spouse provides history  HPI AARIV Tyler Cordova is a 74 y.o. 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 He had a seizure about one week ago and was admitted to the hospital Discharged to a nursing rehabilitation facility. Today he reportedly had a generalized witnessed seizure. Upon EMS arrival, there was no seizure activity, however the patient was minimally responsive. He does have a history of dementia.  Wife reports patient had a cough and has recently been treated for bronchitis.    Past Medical History  Diagnosis Date  . 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 Alzheimer's dementia     Rapidly progressive Alzheimer's Dementia. Still at home but now with increasing behavioral problems. Uses Diapers for Bowel and Urinary leakage. Wife normally takes care of him at home but she and most of his family are in [place] for an extended period of time so he is staying at [facility] assisted living for time being  . Diabetes mellitus without complication ([place])     Dx 10/27/2012, followed with Delrae Rend MD   . Bladder cancer [facility])     Multifocal Low-Grade Transitional Cell Carcinoma; followed by Ivin Booty, MD([facility])  . Hyperlipidemia     On statin; dx 10/27/2012, followed with Delrae Rend MD    . 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 Dementia     Patient Active Problem List   Diagnosis Date Noted  . UTI (urinary tract infection) 12/27/2014  . Ureteral cancer ([place])   . Seizures ([place])   . Alzheimer's dementia   . Diabetes mellitus without complication ([place])   . Bladder cancer ([place])   . Hyperlipidemia     Past Surgical History  Procedure Laterality Date  . Ureteroscopies      for bladder cancer    Current Outpatient Rx  Name  Route  Sig  Dispense  Refill  . aspirin EC 325 MG tablet   Oral   Take 325 mg by mouth at bedtime.         . metFORMIN (GLUCOPHAGE) 500 MG tablet   Oral   Take 500 mg by mouth 2 (two) times daily.          . Multiple Vitamin (MULTIVITAMIN WITH MINERALS) TABS tablet   Oral   Take 1 tablet by mouth daily.         . simvastatin (ZOCOR) 20 MG tablet   Oral   Take 20 mg by mouth at bedtime.         . sulfamethoxazole-trimethoprim (BACTRIM DS,SEPTRA DS) 800-160 MG tablet   Oral   Take 1 tablet by mouth 2 (two) times daily.   10 tablet   0   . tamsulosin (FLOMAX) 0.4 MG CAPS capsule   Oral   Take 0.4 mg by mouth at bedtime.          Marland Kitchen  vitamin B-12 (CYANOCOBALAMIN) 1000 MCG tablet   Oral   Take 1,000 mcg by mouth daily.         . phenytoin (DILANTIN) 100 MG ER capsule   Oral   Take 3 capsules (300 mg total) by mouth at bedtime.   90 capsule   2     Allergies Review of patient's allergies indicates no known allergies.  Family History  Problem Relation Age of Onset  . Cirrhosis Father     Father was an alcoholic    Social History Social History  Substance Use Topics  . Smoking status: Never Smoker   . Smokeless tobacco: None  . Alcohol Use: No    Review of Systems Limited due to patient's altered mental status and dementia. Review of systems discussed with spouse Constitutional: Negative for any recent reported fever. Eyes:  ENT:  Cardiovascular:  Respiratory: Reported positive for  cough Gastrointestinal: No reported vomiting and diarrhea. Genitourinary:  Musculoskeletal:  Skin:  Neurological:  ____________________________________________   PHYSICAL EXAM:  VITAL SIGNS: ED Triage Vitals  Enc Vitals Group     BP 01/01/15 1315 125/64 mmHg     Pulse Rate 01/01/15 1315 85     Resp 01/01/15 1315 19     Temp 01/01/15 1315 97.3 F (36.3 C)     Temp Source 01/01/15 1315 Axillary     SpO2 01/01/15 1315 92 %     Weight 01/01/15 1315 145 lb (65.772 kg)     Height 01/01/15 1315 5\' 4"  (1.626 m)     Head Cir --      Peak Flow --      Pain Score --      Pain Loc --      Pain Edu? --      Excl. in [place]? --      Constitutional: Alert and intermittently able to follow some commands. Well appearing overall and in no distress. Eyes: Conjunctivae are normal. PERRL. Normal extraocular movements. ENT   Head: Normocephalic and atraumatic.   Nose: No congestion/rhinnorhea.   Mouth/Throat: Mucous membranes are moderately dry.   Neck: No stridor. Cardiovascular/Chest: Normal rate, regular rhythm.  No murmurs, rubs, or gallops. Respiratory: Normal respiratory effort without tachypnea nor retractions. Breath sounds are clear and equal bilaterally. No wheezes/rales/rhonchi. Gastrointestinal: Soft. No distention, no guarding, no rebound. Nontender   Genitourinary/rectal:Deferred Musculoskeletal: Nontender with normal range of motion in all extremities. No joint effusions.  No lower extremity tenderness.  No edema. Neurologic:  Opens eyes to voice. Intermittently follows commands. Minimal speech, but what he does say he does not appear to be dysarthric. Unable to cooperate with full strength testing, however he does move right upper externa, right lower shotty, and left lower extremity extremities. Left upper extremity appears to be weak with contracture. Skin:  Skin is warm, dry and intact. No rash noted.   ____________________________________________   EKG I,  Lisa Roca, MD, the attending physician have personally viewed and interpreted all ECGs.  71 bpm. Normal sinus rhythm. Narrow QRS. Normal axis. Normal ST and T-wave. ____________________________________________  LABS (pertinent positives/negatives)  Urinalysis negative Basic metabolic panel without significant abnormality CBC significant for hemoglobin 12.1 Troponin less than 0.03  ____________________________________________  RADIOLOGY All Xrays were viewed by me. Imaging interpreted by Radiologist.  CT head noncontrast:  IMPRESSION: 1. Today's study is significantly limited by a large amount of patient motion, however, there are no definite acute intracranial abnormalities identified. 2. Mild moderate cerebral atrophy with extensive chronic ischemic changes  redemonstrated, as detailed above. __________________________________________  PROCEDURES  Procedure(s) performed: None  Critical Care performed: None  ____________________________________________   ED COURSE / ASSESSMENT AND PLAN  CONSULTATIONS: Phone consultation with neurologist, Dr. Jayme Cloud. Recommends IV Dilantin load 1000 mg. Discharged home with Dilantin 300 mg daily at bedtime.   Pertinent labs & imaging results that were available during my care of the patient were reviewed by me and considered in my medical decision making (see chart for details).   Patient arrived with altered mental status, decreased ability to communicate and interact, consistent with postictal state based on reported witnessed generalized tonic-clonic seizure.  Wife reports he has had some very intermittent seizures over the past number of years, but never started on medication due to the very infrequent nature of the seizure. Given that this is a second seizure within the last 2 weeks, I discussed the case with the neurologist, who recommended IV Dilantin load as well as starting on Dilantin by mouth.   Patient / Family /  Caregiver informed of clinical course, medical decision-making process, and agree with plan.   I discussed return precautions, follow-up instructions, and discharged instructions with patient and/or family.  ___________________________________________   FINAL CLINICAL IMPRESSION(S) / ED DIAGNOSES   Final diagnoses:  Seizure ([place])       Lisa Roca, MD 01/01/15 [phone]

## 2015-01-01 NOTE — ED Notes (Signed)
Pt in and out cathed for urine sample. Pt cleaned and linen changed. Pt drinking water, family at bedside.

## 2015-01-01 NOTE — ED Notes (Signed)
Tyler Beach, NP from the [facility] at [place] called to report staff witnessed a grand mal seizure with left side decorticate posturing, pt had no pupillary response to light. Reports the seizure began approximately 12:10 pm and ended approximately 12:15 p.m. Dr. Reita Cliche notified.

## 2015-01-01 NOTE — ED Notes (Signed)
md in with pt and family.   meds started iv.  siderails up x 2.  Sinus on monitor.  Skin warm and dry.

## 2015-01-01 NOTE — ED Notes (Signed)
Pt discharged back to [facility] [place].  Ems for transport.  No family with pt at time of disscharge.  Iv dc'ed.  Pt unable to sign e-signature.

## 2015-01-01 NOTE — ED Notes (Addendum)
Report called and given to [place] at [facility] [place]

## 2015-01-02 DIAGNOSIS — E119 Type 2 diabetes mellitus without complications: Secondary | ICD-10-CM | POA: Diagnosis not present

## 2015-01-02 LAB — GLUCOSE, CAPILLARY
Glucose-Capillary: 81 mg/dL (ref 65–99)
Glucose-Capillary: 128 mg/dL — ABNORMAL HIGH (ref 65–99)
Glucose-Capillary: 102 mg/dL — ABNORMAL HIGH (ref 65–99)

## 2015-01-03 DIAGNOSIS — E119 Type 2 diabetes mellitus without complications: Secondary | ICD-10-CM | POA: Diagnosis not present

## 2015-01-03 LAB — GLUCOSE, CAPILLARY
GLUCOSE-CAPILLARY: 90 mg/dL (ref 65–99)
Glucose-Capillary: 129 mg/dL — ABNORMAL HIGH (ref 65–99)
Glucose-Capillary: 142 mg/dL — ABNORMAL HIGH (ref 65–99)
Glucose-Capillary: 160 mg/dL — ABNORMAL HIGH (ref 65–99)

## 2015-01-04 DIAGNOSIS — E119 Type 2 diabetes mellitus without complications: Secondary | ICD-10-CM | POA: Diagnosis not present

## 2015-01-04 LAB — GLUCOSE, CAPILLARY
GLUCOSE-CAPILLARY: 128 mg/dL — AB (ref 65–99)
GLUCOSE-CAPILLARY: 134 mg/dL — AB (ref 65–99)
Glucose-Capillary: 174 mg/dL — ABNORMAL HIGH (ref 65–99)

## 2015-01-05 DIAGNOSIS — E119 Type 2 diabetes mellitus without complications: Secondary | ICD-10-CM | POA: Diagnosis not present

## 2015-01-05 LAB — GLUCOSE, CAPILLARY
GLUCOSE-CAPILLARY: 100 mg/dL — AB (ref 65–99)
Glucose-Capillary: 135 mg/dL — ABNORMAL HIGH (ref 65–99)

## 2015-01-06 DIAGNOSIS — E119 Type 2 diabetes mellitus without complications: Secondary | ICD-10-CM | POA: Diagnosis not present

## 2015-01-06 LAB — GLUCOSE, CAPILLARY
GLUCOSE-CAPILLARY: 107 mg/dL — AB (ref 65–99)
GLUCOSE-CAPILLARY: 94 mg/dL (ref 65–99)
GLUCOSE-CAPILLARY: 139 mg/dL — AB (ref 65–99)
Glucose-Capillary: 132 mg/dL — ABNORMAL HIGH (ref 65–99)

## 2015-01-07 DIAGNOSIS — E119 Type 2 diabetes mellitus without complications: Secondary | ICD-10-CM | POA: Diagnosis not present

## 2015-01-07 LAB — GLUCOSE, CAPILLARY
GLUCOSE-CAPILLARY: 101 mg/dL — AB (ref 65–99)
Glucose-Capillary: 138 mg/dL — ABNORMAL HIGH (ref 65–99)
Glucose-Capillary: 121 mg/dL — ABNORMAL HIGH (ref 65–99)
Glucose-Capillary: 98 mg/dL (ref 65–99)

## 2015-01-08 LAB — GLUCOSE, CAPILLARY
GLUCOSE-CAPILLARY: 160 mg/dL — AB (ref 65–99)
Glucose-Capillary: 114 mg/dL — ABNORMAL HIGH (ref 65–99)
Glucose-Capillary: 124 mg/dL — ABNORMAL HIGH (ref 65–99)

## 2015-01-09 DIAGNOSIS — E119 Type 2 diabetes mellitus without complications: Secondary | ICD-10-CM | POA: Diagnosis not present

## 2015-01-09 LAB — GLUCOSE, CAPILLARY
GLUCOSE-CAPILLARY: 156 mg/dL — AB (ref 65–99)
Glucose-Capillary: 108 mg/dL — ABNORMAL HIGH (ref 65–99)

## 2015-01-10 DIAGNOSIS — E119 Type 2 diabetes mellitus without complications: Secondary | ICD-10-CM | POA: Diagnosis not present

## 2015-01-10 LAB — GLUCOSE, CAPILLARY
GLUCOSE-CAPILLARY: 104 mg/dL — AB (ref 65–99)
Glucose-Capillary: 151 mg/dL — ABNORMAL HIGH (ref 65–99)

## 2015-01-11 DIAGNOSIS — E119 Type 2 diabetes mellitus without complications: Secondary | ICD-10-CM | POA: Diagnosis not present

## 2015-01-11 LAB — GLUCOSE, CAPILLARY: GLUCOSE-CAPILLARY: 107 mg/dL — AB (ref 65–99)

## 2015-01-12 LAB — GLUCOSE, CAPILLARY
Glucose-Capillary: 122 mg/dL — ABNORMAL HIGH (ref 65–99)
Glucose-Capillary: 104 mg/dL — ABNORMAL HIGH (ref 65–99)
Glucose-Capillary: 101 mg/dL — ABNORMAL HIGH (ref 65–99)

## 2015-01-13 DIAGNOSIS — E119 Type 2 diabetes mellitus without complications: Secondary | ICD-10-CM | POA: Diagnosis not present

## 2015-01-13 LAB — GLUCOSE, CAPILLARY
Glucose-Capillary: 108 mg/dL — ABNORMAL HIGH (ref 65–99)
Glucose-Capillary: 113 mg/dL — ABNORMAL HIGH (ref 65–99)

## 2015-01-14 LAB — GLUCOSE, CAPILLARY
GLUCOSE-CAPILLARY: 162 mg/dL — AB (ref 65–99)
GLUCOSE-CAPILLARY: 112 mg/dL — AB (ref 65–99)
GLUCOSE-CAPILLARY: 108 mg/dL — AB (ref 65–99)

## 2015-01-15 DIAGNOSIS — E119 Type 2 diabetes mellitus without complications: Secondary | ICD-10-CM | POA: Diagnosis not present

## 2015-01-15 LAB — GLUCOSE, CAPILLARY: GLUCOSE-CAPILLARY: 101 mg/dL — AB (ref 65–99)

## 2015-01-16 DIAGNOSIS — E119 Type 2 diabetes mellitus without complications: Secondary | ICD-10-CM | POA: Diagnosis not present

## 2015-01-16 LAB — GLUCOSE, CAPILLARY
GLUCOSE-CAPILLARY: 117 mg/dL — AB (ref 65–99)
Glucose-Capillary: 106 mg/dL — ABNORMAL HIGH (ref 65–99)

## 2015-06-30 DEATH — deceased

## 2016-02-23 IMAGING — CT CT HEAD W/O CM
4 of 5 series · 18 of 30 positions shown, 19 images · non-contrast
Comparison: Head CT 10/14/2014.  Brain MRI 12/25/2014.

CLINICAL DATA: 74-year-old male with history of bladder cancer,
reportedly witnessed having seizure like activity. History of
dementia. Prior history of stroke.

EXAM:
CT HEAD WITHOUT CONTRAST
TECHNIQUE: Contiguous axial images were obtained from the base of the skull
through the vertex without intravenous contrast.

[Series 2: soft tissue · axial · 0.41mm/px · z∈[-128,+26]mm · 3 of 32 slices shown, 4 images]
[im 1/32  brain]
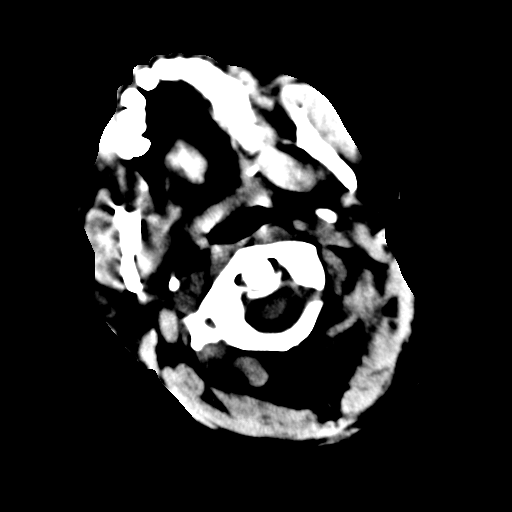
[im 1/32  bone]
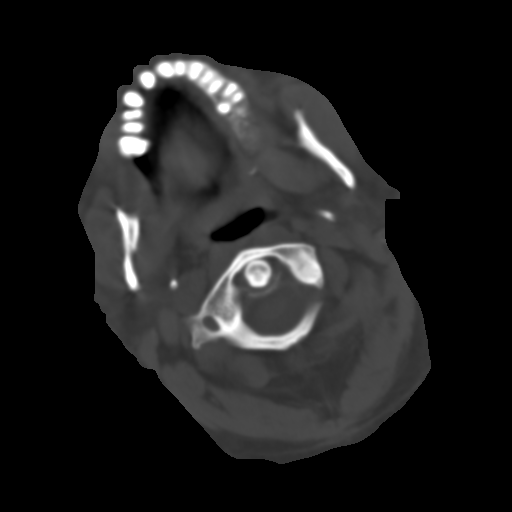
[im 16/32  brain]
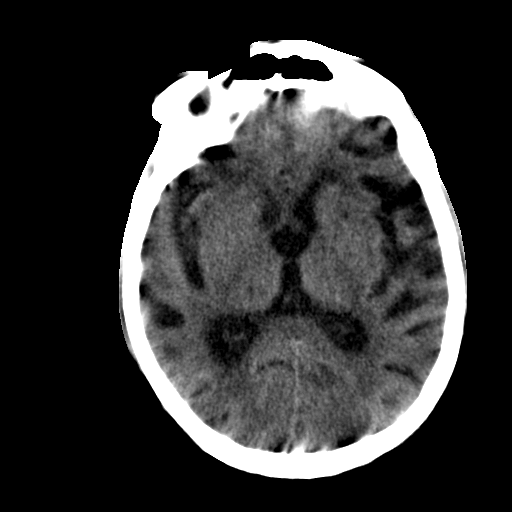
[im 32/32  brain]
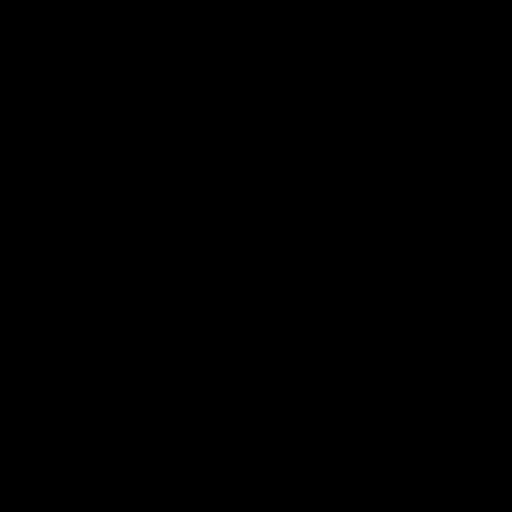

[Series 3: bone · axial · 0.41mm/px · z∈[-115,+11]mm · 6 of 89 slices shown]
[im 13/89  bone]
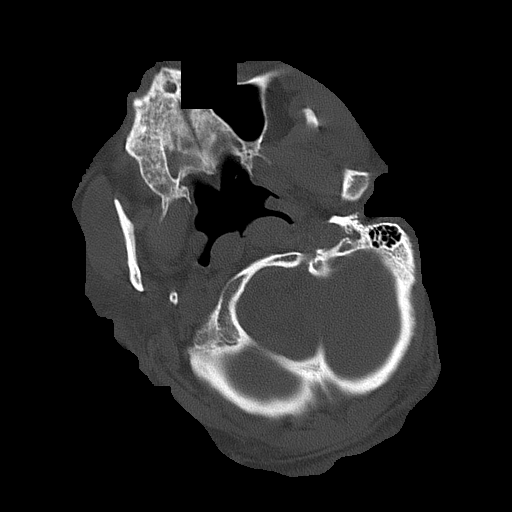
[im 26/89  bone]
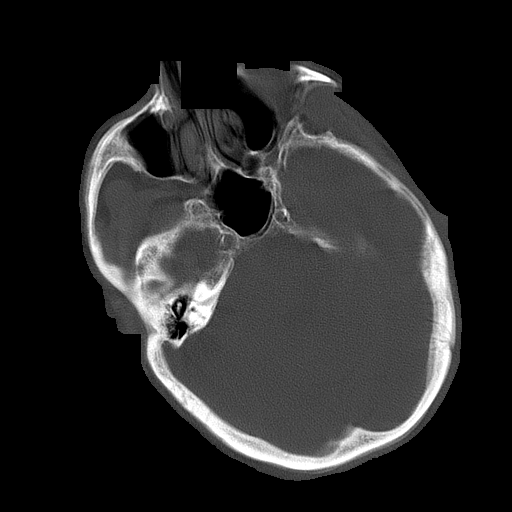
[im 38/89  bone]
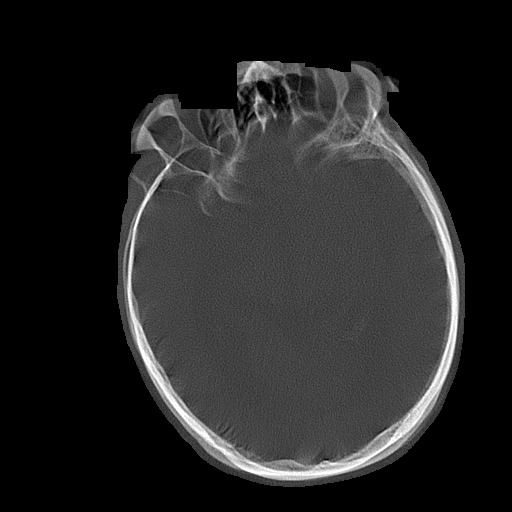
[im 51/89  bone]
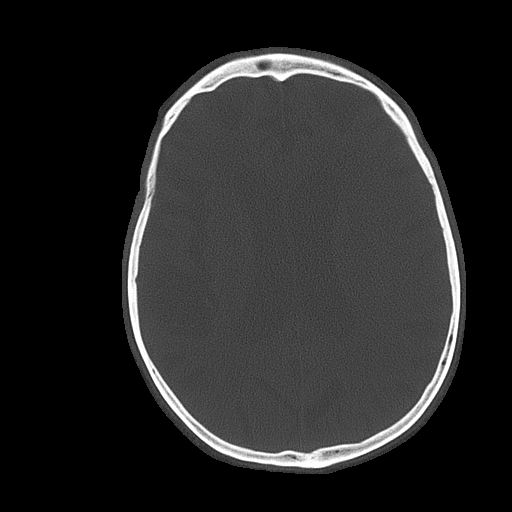
[im 63/89  bone]
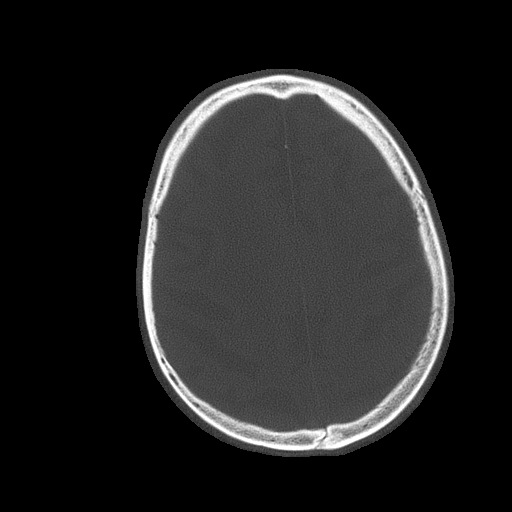
[im 76/89  bone]
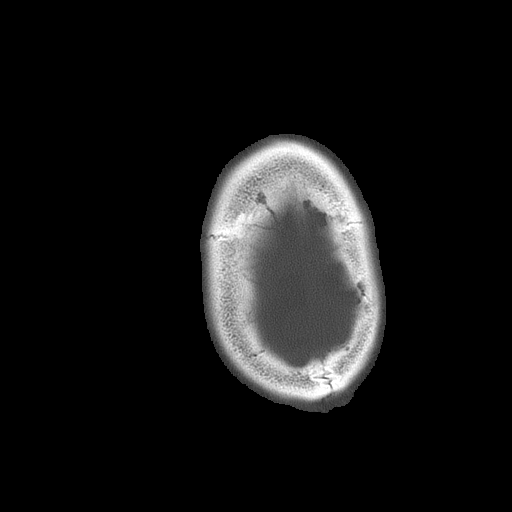

[Series 5: head bone · axial · 0.41mm/px · z∈[-109,+11]mm · 6 of 85 slices shown]
[im 13/85  bone]
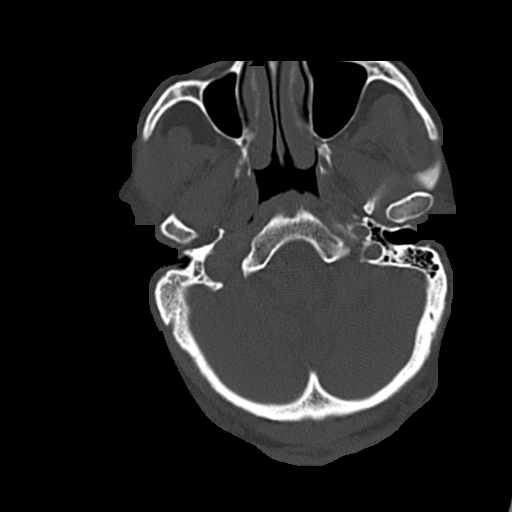
[im 25/85  bone]
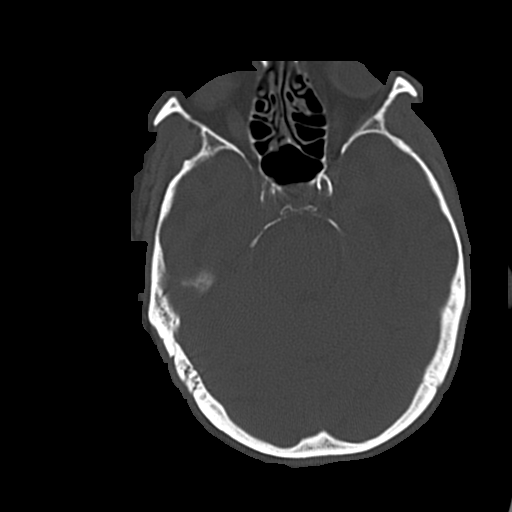
[im 37/85  bone]
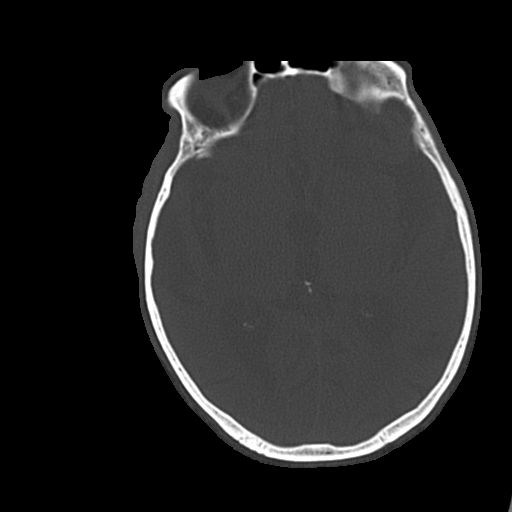
[im 49/85  bone]
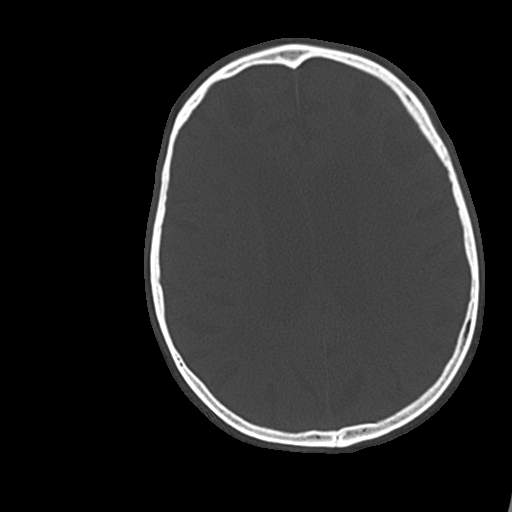
[im 61/85  bone]
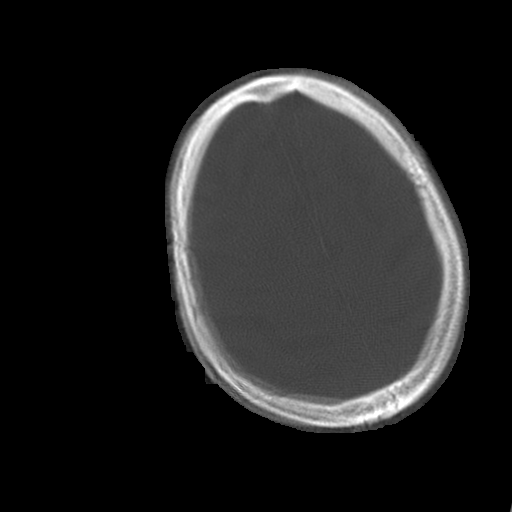
[im 73/85  bone]
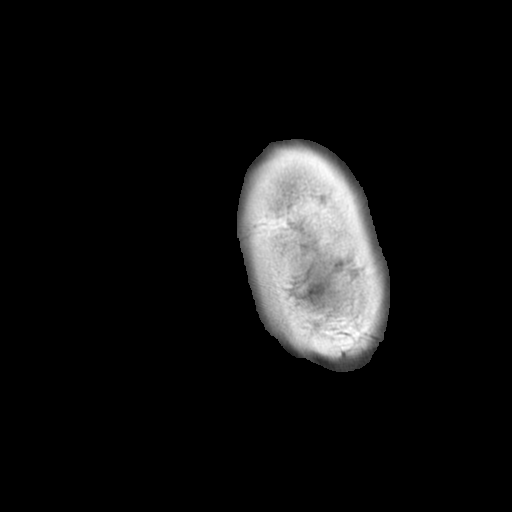

[Series 7: bone recon · axial · 0.38mm/px · z∈[-23,+16]mm · 3 of 86 slices shown]
[im 13/86  bone]
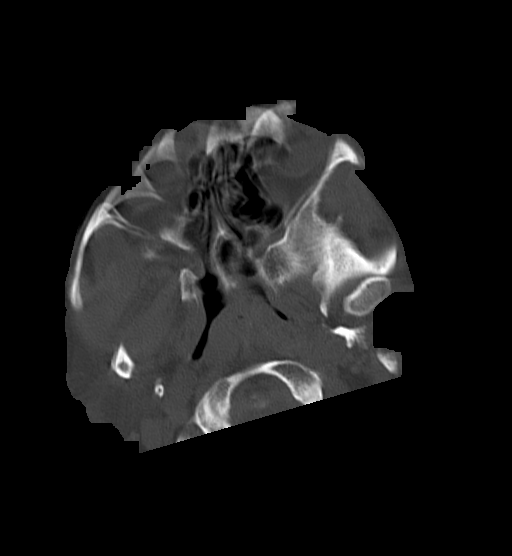
[im 25/86  bone]
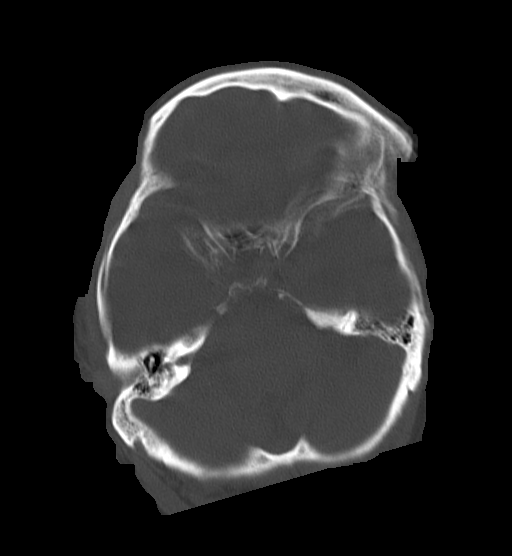
[im 37/86  bone]
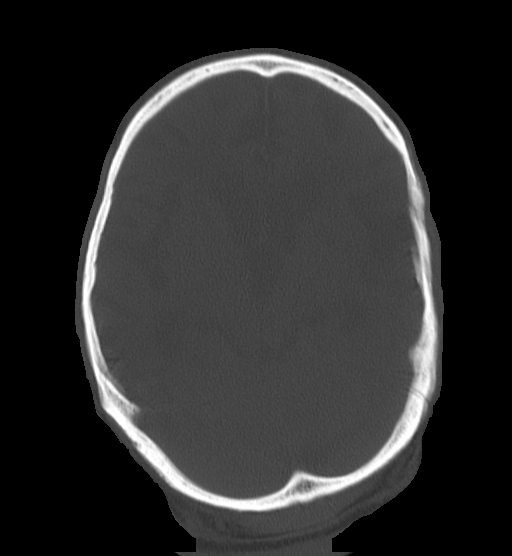

[18 of 30 positions shown; findings below may reference images not displayed]

FINDINGS: Study is severely limited by patient motion. With these limitations
in mind, there is mild moderate cerebral atrophy. Patchy and
confluent areas of decreased attenuation are noted throughout the
deep and periventricular white matter of the cerebral hemispheres
bilaterally, compatible with chronic microvascular ischemic disease.
Multiple well-defined areas of low attenuation in the basal ganglia
bilaterally, compatible with old lacunar infarcts, similar prior
studies. Well-defined area of low attenuation in the right temporal
lobe, similar to the prior study, compatible with encephalomalacia
from remote infarction. No acute intracranial abnormalities.
Specifically, no evidence of acute intracranial hemorrhage, no
definite findings of acute/subacute cerebral ischemia, no mass, mass
effect, hydrocephalus or abnormal intra or extra-axial fluid
collections. Visualized paranasal sinuses and mastoids are well
pneumatized. No acute displaced skull fractures are identified.
IMPRESSION: 1. Today's study is significantly limited by a large amount of
patient motion, however, there are no definite acute intracranial
abnormalities identified.
2. Mild moderate cerebral atrophy with extensive chronic ischemic
changes redemonstrated, as detailed above.
